# Patient Record
Sex: Female | Born: 1970 | Race: White | Hispanic: No | Marital: Married | Smoking: Never smoker
Health system: Southern US, Community
[De-identification: ages and names within clinical notes are randomized; demographics above are authoritative.]

## PROBLEM LIST (undated history)

## (undated) DIAGNOSIS — I2510 Atherosclerotic heart disease of native coronary artery without angina pectoris: Secondary | ICD-10-CM

## (undated) DIAGNOSIS — T4145XA Adverse effect of unspecified anesthetic, initial encounter: Secondary | ICD-10-CM

## (undated) DIAGNOSIS — T7840XA Allergy, unspecified, initial encounter: Secondary | ICD-10-CM

## (undated) DIAGNOSIS — F329 Major depressive disorder, single episode, unspecified: Secondary | ICD-10-CM

## (undated) DIAGNOSIS — F32A Depression, unspecified: Secondary | ICD-10-CM

## (undated) DIAGNOSIS — K219 Gastro-esophageal reflux disease without esophagitis: Secondary | ICD-10-CM

## (undated) DIAGNOSIS — T8859XA Other complications of anesthesia, initial encounter: Secondary | ICD-10-CM

## (undated) DIAGNOSIS — K649 Unspecified hemorrhoids: Secondary | ICD-10-CM

## (undated) DIAGNOSIS — N390 Urinary tract infection, site not specified: Secondary | ICD-10-CM

## (undated) HISTORY — DX: Depression, unspecified: F32.A

## (undated) HISTORY — DX: Allergy, unspecified, initial encounter: T78.40XA

## (undated) HISTORY — DX: Gastro-esophageal reflux disease without esophagitis: K21.9

## (undated) HISTORY — DX: Unspecified hemorrhoids: K64.9

## (undated) HISTORY — PX: UPPER GASTROINTESTINAL ENDOSCOPY: SHX188

## (undated) HISTORY — DX: Atherosclerotic heart disease of native coronary artery without angina pectoris: I25.10

## (undated) HISTORY — DX: Major depressive disorder, single episode, unspecified: F32.9

## (undated) HISTORY — DX: Urinary tract infection, site not specified: N39.0

---
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
## 1997-12-12 ENCOUNTER — Inpatient Hospital Stay (HOSPITAL_COMMUNITY): Admission: AD | Admit: 1997-12-12 | Discharge: 1997-12-12 | Payer: Self-pay | Admitting: *Deleted

## 1998-01-24 ENCOUNTER — Inpatient Hospital Stay (HOSPITAL_COMMUNITY): Admission: AD | Admit: 1998-01-24 | Discharge: 1998-01-24 | Payer: Self-pay | Admitting: Obstetrics and Gynecology

## 1998-02-24 ENCOUNTER — Inpatient Hospital Stay (HOSPITAL_COMMUNITY): Admission: AD | Admit: 1998-02-24 | Discharge: 1998-02-24 | Payer: Self-pay | Admitting: *Deleted

## 1998-02-28 ENCOUNTER — Inpatient Hospital Stay (HOSPITAL_COMMUNITY): Admission: AD | Admit: 1998-02-28 | Discharge: 1998-03-02 | Payer: Self-pay | Admitting: Obstetrics and Gynecology

## 1998-04-11 ENCOUNTER — Inpatient Hospital Stay (HOSPITAL_COMMUNITY): Admission: AD | Admit: 1998-04-11 | Discharge: 1998-04-11 | Payer: Self-pay | Admitting: Obstetrics and Gynecology

## 1998-04-16 ENCOUNTER — Inpatient Hospital Stay (HOSPITAL_COMMUNITY): Admission: AD | Admit: 1998-04-16 | Discharge: 1998-04-18 | Payer: Self-pay | Admitting: Obstetrics and Gynecology

## 1998-04-22 ENCOUNTER — Encounter (HOSPITAL_COMMUNITY): Admission: RE | Admit: 1998-04-22 | Discharge: 1998-07-21 | Payer: Self-pay | Admitting: *Deleted

## 2000-09-19 ENCOUNTER — Other Ambulatory Visit: Admission: RE | Admit: 2000-09-19 | Discharge: 2000-09-19 | Payer: Self-pay | Admitting: Obstetrics and Gynecology

## 2000-12-08 ENCOUNTER — Inpatient Hospital Stay (HOSPITAL_COMMUNITY): Admission: AD | Admit: 2000-12-08 | Discharge: 2000-12-08 | Payer: Self-pay | Admitting: Obstetrics and Gynecology

## 2001-02-03 ENCOUNTER — Inpatient Hospital Stay (HOSPITAL_COMMUNITY): Admission: AD | Admit: 2001-02-03 | Discharge: 2001-02-03 | Payer: Self-pay | Admitting: Obstetrics & Gynecology

## 2001-03-23 ENCOUNTER — Inpatient Hospital Stay (HOSPITAL_COMMUNITY): Admission: AD | Admit: 2001-03-23 | Discharge: 2001-03-24 | Payer: Self-pay | Admitting: Obstetrics and Gynecology

## 2001-04-02 ENCOUNTER — Encounter: Admission: RE | Admit: 2001-04-02 | Discharge: 2001-05-02 | Payer: Self-pay | Admitting: Obstetrics and Gynecology

## 2002-05-29 ENCOUNTER — Other Ambulatory Visit: Admission: RE | Admit: 2002-05-29 | Discharge: 2002-05-29 | Payer: Self-pay | Admitting: Obstetrics and Gynecology

## 2002-12-24 ENCOUNTER — Other Ambulatory Visit: Admission: RE | Admit: 2002-12-24 | Discharge: 2002-12-24 | Payer: Self-pay | Admitting: Obstetrics and Gynecology

## 2003-06-25 ENCOUNTER — Other Ambulatory Visit: Admission: RE | Admit: 2003-06-25 | Discharge: 2003-06-25 | Payer: Self-pay | Admitting: Obstetrics and Gynecology

## 2004-05-12 ENCOUNTER — Other Ambulatory Visit: Admission: RE | Admit: 2004-05-12 | Discharge: 2004-05-12 | Payer: Self-pay | Admitting: Obstetrics and Gynecology

## 2005-04-21 ENCOUNTER — Ambulatory Visit: Payer: Self-pay | Admitting: Internal Medicine

## 2005-05-05 ENCOUNTER — Encounter (INDEPENDENT_AMBULATORY_CARE_PROVIDER_SITE_OTHER): Payer: Self-pay | Admitting: *Deleted

## 2005-05-05 ENCOUNTER — Ambulatory Visit: Payer: Self-pay | Admitting: Internal Medicine

## 2005-08-23 ENCOUNTER — Other Ambulatory Visit: Admission: RE | Admit: 2005-08-23 | Discharge: 2005-08-23 | Payer: Self-pay | Admitting: Obstetrics and Gynecology

## 2005-09-08 ENCOUNTER — Encounter: Admission: RE | Admit: 2005-09-08 | Discharge: 2005-09-08 | Payer: Self-pay | Admitting: Obstetrics and Gynecology

## 2007-01-03 ENCOUNTER — Encounter: Admission: RE | Admit: 2007-01-03 | Discharge: 2007-01-03 | Payer: Self-pay | Admitting: Obstetrics and Gynecology

## 2007-02-15 ENCOUNTER — Encounter: Admission: RE | Admit: 2007-02-15 | Discharge: 2007-02-15 | Payer: Self-pay | Admitting: Family Medicine

## 2009-04-14 ENCOUNTER — Inpatient Hospital Stay (HOSPITAL_COMMUNITY): Admission: AD | Admit: 2009-04-14 | Discharge: 2009-04-14 | Payer: Self-pay | Admitting: Obstetrics & Gynecology

## 2009-08-23 ENCOUNTER — Inpatient Hospital Stay (HOSPITAL_COMMUNITY): Admission: AD | Admit: 2009-08-23 | Discharge: 2009-08-24 | Payer: Self-pay | Admitting: Obstetrics and Gynecology

## 2009-08-25 ENCOUNTER — Inpatient Hospital Stay (HOSPITAL_COMMUNITY): Admission: AD | Admit: 2009-08-25 | Discharge: 2009-08-27 | Payer: Self-pay | Admitting: Obstetrics and Gynecology

## 2009-08-25 ENCOUNTER — Encounter (INDEPENDENT_AMBULATORY_CARE_PROVIDER_SITE_OTHER): Payer: Self-pay | Admitting: Obstetrics and Gynecology

## 2010-07-18 ENCOUNTER — Encounter: Payer: Self-pay | Admitting: Obstetrics and Gynecology

## 2010-09-17 LAB — CBC
HCT: 29.9 % — ABNORMAL LOW (ref 36.0–46.0)
HCT: 34.2 % — ABNORMAL LOW (ref 36.0–46.0)
Hemoglobin: 10.3 g/dL — ABNORMAL LOW (ref 12.0–15.0)
Hemoglobin: 11.8 g/dL — ABNORMAL LOW (ref 12.0–15.0)
MCHC: 34.5 g/dL (ref 30.0–36.0)
MCV: 93.2 fL (ref 78.0–100.0)
Platelets: 139 10*3/uL — ABNORMAL LOW (ref 150–400)
RBC: 3.69 MIL/uL — ABNORMAL LOW (ref 3.87–5.11)

## 2010-09-30 LAB — URINALYSIS, ROUTINE W REFLEX MICROSCOPIC
Bilirubin Urine: NEGATIVE
Glucose, UA: NEGATIVE mg/dL
Hgb urine dipstick: NEGATIVE
Ketones, ur: NEGATIVE mg/dL
Nitrite: NEGATIVE
Specific Gravity, Urine: 1.01 (ref 1.005–1.030)

## 2010-09-30 LAB — CBC
HCT: 34.1 % — ABNORMAL LOW (ref 36.0–46.0)
Hemoglobin: 11.7 g/dL — ABNORMAL LOW (ref 12.0–15.0)
Platelets: 174 10*3/uL (ref 150–400)
RBC: 3.73 MIL/uL — ABNORMAL LOW (ref 3.87–5.11)

## 2010-09-30 LAB — WET PREP, GENITAL: Trich, Wet Prep: NONE SEEN

## 2010-09-30 LAB — URINE CULTURE

## 2012-02-01 ENCOUNTER — Other Ambulatory Visit (HOSPITAL_COMMUNITY): Payer: Self-pay | Admitting: Obstetrics and Gynecology

## 2012-02-01 ENCOUNTER — Other Ambulatory Visit: Payer: Self-pay | Admitting: Obstetrics and Gynecology

## 2012-02-01 DIAGNOSIS — N971 Female infertility of tubal origin: Secondary | ICD-10-CM

## 2012-02-13 ENCOUNTER — Ambulatory Visit (HOSPITAL_COMMUNITY): Payer: Self-pay

## 2013-01-29 ENCOUNTER — Emergency Department (HOSPITAL_COMMUNITY)
Admission: EM | Admit: 2013-01-29 | Discharge: 2013-01-29 | Disposition: A | Discharge status: Home / Self Care | Payer: Managed Care, Other (non HMO) | Attending: Emergency Medicine | Admitting: Emergency Medicine

## 2013-01-29 ENCOUNTER — Emergency Department (HOSPITAL_COMMUNITY): Payer: Managed Care, Other (non HMO)

## 2013-01-29 ENCOUNTER — Encounter (HOSPITAL_COMMUNITY): Payer: Self-pay | Admitting: Anesthesiology

## 2013-01-29 ENCOUNTER — Encounter (HOSPITAL_COMMUNITY): Payer: Self-pay | Admitting: Emergency Medicine

## 2013-01-29 ENCOUNTER — Encounter (HOSPITAL_COMMUNITY)
Admission: EM | Disposition: A | Discharge status: Home / Self Care | Payer: Self-pay | Source: Home / Self Care | Attending: Emergency Medicine

## 2013-01-29 ENCOUNTER — Emergency Department (HOSPITAL_COMMUNITY): Payer: Managed Care, Other (non HMO) | Admitting: Anesthesiology

## 2013-01-29 DIAGNOSIS — R6889 Other general symptoms and signs: Secondary | ICD-10-CM | POA: Insufficient documentation

## 2013-01-29 DIAGNOSIS — T17408A Unspecified foreign body in trachea causing other injury, initial encounter: Secondary | ICD-10-CM

## 2013-01-29 HISTORY — PX: DIRECT LARYNGOSCOPY: SHX5326

## 2013-01-29 SURGERY — LARYNGOSCOPY, DIRECT
Anesthesia: General | Site: Esophagus | Wound class: Clean Contaminated

## 2013-01-29 MED ORDER — LORAZEPAM 1 MG PO TABS: 1.0000 mg | ORAL_TABLET | Freq: Once | ORAL | Status: DC

## 2013-01-29 MED ORDER — LACTATED RINGERS IV SOLN
INTRAVENOUS | Status: DC | PRN
  Administered 2013-01-29: 05:00:00 via INTRAVENOUS

## 2013-01-29 MED ORDER — ROCURONIUM BROMIDE 50 MG/5ML IV SOLN
INTRAVENOUS | Status: AC
  Filled 2013-01-29: qty 2

## 2013-01-29 MED ORDER — MIDAZOLAM HCL 5 MG/5ML IJ SOLN
INTRAMUSCULAR | Status: DC | PRN
  Administered 2013-01-29: 2 mg via INTRAVENOUS

## 2013-01-29 MED ORDER — FENTANYL CITRATE 0.05 MG/ML IJ SOLN: 25.0000 ug | INTRAMUSCULAR | Status: DC | PRN

## 2013-01-29 MED ORDER — LACTATED RINGERS IV SOLN: INTRAVENOUS | Status: DC

## 2013-01-29 MED ORDER — ONDANSETRON HCL 4 MG/2ML IJ SOLN
INTRAMUSCULAR | Status: DC | PRN
  Administered 2013-01-29: 4 mg via INTRAVENOUS

## 2013-01-29 MED ORDER — PROMETHAZINE HCL 25 MG/ML IJ SOLN: 6.2500 mg | INTRAMUSCULAR | Status: DC | PRN

## 2013-01-29 MED ORDER — NEOSTIGMINE METHYLSULFATE 1 MG/ML IJ SOLN
INTRAMUSCULAR | Status: DC | PRN
  Administered 2013-01-29: 5 mg via INTRAVENOUS

## 2013-01-29 MED ORDER — GLYCOPYRROLATE 0.2 MG/ML IJ SOLN
INTRAMUSCULAR | Status: DC | PRN
  Administered 2013-01-29: 0.6 mg via INTRAVENOUS

## 2013-01-29 MED ORDER — ETOMIDATE 2 MG/ML IV SOLN
INTRAVENOUS | Status: AC
  Filled 2013-01-29: qty 20

## 2013-01-29 MED ORDER — GLUCAGON HCL (RDNA) 1 MG IJ SOLR
2.0000 mg | Freq: Once | INTRAMUSCULAR | Status: AC
  Administered 2013-01-29: 2 mg via INTRAVENOUS
  Filled 2013-01-29: qty 1

## 2013-01-29 MED ORDER — DEXAMETHASONE SODIUM PHOSPHATE 10 MG/ML IJ SOLN
INTRAMUSCULAR | Status: DC | PRN
  Administered 2013-01-29: 10 mg via INTRAVENOUS

## 2013-01-29 MED ORDER — FENTANYL CITRATE 0.05 MG/ML IJ SOLN
INTRAMUSCULAR | Status: DC | PRN
  Administered 2013-01-29: 50 ug via INTRAVENOUS

## 2013-01-29 MED ORDER — PROPOFOL 10 MG/ML IV BOLUS
INTRAVENOUS | Status: DC | PRN
  Administered 2013-01-29: 150 mg via INTRAVENOUS

## 2013-01-29 MED ORDER — MEPERIDINE HCL 50 MG/ML IJ SOLN: 6.2500 mg | INTRAMUSCULAR | Status: DC | PRN

## 2013-01-29 MED ORDER — CISATRACURIUM BESYLATE (PF) 10 MG/5ML IV SOLN
INTRAVENOUS | Status: DC | PRN
  Administered 2013-01-29: 4 mg via INTRAVENOUS

## 2013-01-29 MED ORDER — SUCCINYLCHOLINE CHLORIDE 20 MG/ML IJ SOLN
INTRAMUSCULAR | Status: DC | PRN
  Administered 2013-01-29: 100 mg via INTRAVENOUS

## 2013-01-29 MED ORDER — SUCCINYLCHOLINE CHLORIDE 20 MG/ML IJ SOLN
INTRAMUSCULAR | Status: AC
  Filled 2013-01-29: qty 5

## 2013-01-29 MED ORDER — LIDOCAINE HCL (CARDIAC) 20 MG/ML IV SOLN
INTRAVENOUS | Status: AC
  Filled 2013-01-29: qty 5

## 2013-01-29 MED ORDER — LORAZEPAM 2 MG/ML IJ SOLN
1.0000 mg | Freq: Once | INTRAMUSCULAR | Status: AC
  Administered 2013-01-29: 1 mg via INTRAVENOUS
  Filled 2013-01-29: qty 1

## 2013-01-29 SURGICAL SUPPLY — 2 items
DRAPE LG THREE QUARTER DISP (DRAPES) ×1 IMPLANT
TUBING CONNECTING 10 (TUBING) ×1 IMPLANT

## 2013-01-29 NOTE — Anesthesia Postprocedure Evaluation (Signed)
  Anesthesia Post-op Note  Patient: Susan Morales  Procedure(s) Performed: Procedure(s) (LRB): DIRECT LARYNGOSCOPY, esophagoscopy, bronchoscopy,  (N/A)  Patient Location: PACU  Anesthesia Type: General  Level of Consciousness: awake and alert   Airway and Oxygen Therapy: Patient Spontanous Breathing  Post-op Pain: mild  Post-op Assessment: Post-op Vital signs reviewed, Patient's Cardiovascular Status Stable, Respiratory Function Stable, Patent Airway and No signs of Nausea or vomiting  Last Vitals:  Filed Vitals:   01/29/13 0536  BP:   Pulse: 105  Temp: 36.6 C  Resp: 23    Post-op Vital Signs: stable   Complications: No apparent anesthesia complications

## 2013-01-29 NOTE — ED Provider Notes (Signed)
CSN: [id number]     Arrival date & time 01/29/13  0016 History     First MD Initiated Contact with Patient 01/29/13 [phone]     Chief Complaint  Patient presents with  . Foreign Body   (Consider location/radiation/quality/duration/timing/severity/associated sxs/prior Treatment) HPI  42 year old female with a foreign body sensation in her throat. Onset around 1930 today. The patient was eating steak and felt like she did not adequately to a piece. She's felt like the T-System lodged in her throat since this time. She spits up anytime she tries to drink anything. No respiratory complaints. No wheezing. No nausea or vomiting. No drooling. No change in her voice. No hx of similar symptoms. Patient reports previous endoscopy years ago but no history of esophageal strictures that she is aware of.   History reviewed. No pertinent past medical history. History reviewed. No pertinent past surgical history. History reviewed. No pertinent family history. History  Substance Use Topics  . Smoking status: Not on file  . Smokeless tobacco: Not on file  . Alcohol Use: Not on file   OB History   Grav Para Term Preterm Abortions TAB SAB Ect Mult Living                 Review of Systems  All systems reviewed and negative, other than as noted in HPI.   Allergies  Review of patient's allergies indicates not on file.  Home Medications  No current outpatient prescriptions on file. BP 162/88  Pulse 116  Temp(Src) 98.1 F (36.7 C) (Oral)  Resp 20  Ht 5\' 4"  (1.626 m)  Wt 235 lb (106.595 kg)  BMI 40.32 kg/m2  SpO2 98%  LMP 01/28/2013 Physical Exam  Nursing note and vitals reviewed. Constitutional: She appears well-developed and well-nourished. No distress.  Sitting up in bed. Appears comfortable.  HENT:  Head: Normocephalic and atraumatic.  Posterior pharynx clear. Handling secretions. Normal sounding phonation.   Eyes: Conjunctivae are normal. Right eye exhibits no discharge. Left eye  exhibits no discharge.  Neck: Neck supple.  Cardiovascular: Normal rate, regular rhythm and normal heart sounds.  Exam reveals no gallop and no friction rub.   No murmur heard. Pulmonary/Chest: Effort normal and breath sounds normal. No stridor. No respiratory distress. She has no wheezes.  No increased wob. Lungs clear.   Abdominal: Soft. She exhibits no distension. There is no tenderness.  Musculoskeletal: She exhibits no edema and no tenderness.  Neurological: She is alert.  Skin: Skin is warm and dry.  Psychiatric: She has a normal mood and affect. Her behavior is normal. Thought content normal.    ED Course   Procedures (including critical care time)  CRITICAL CARE Performed by: Raeford Razor  Total critical care time: 30 minutes  Critical care time was exclusive of separately billable procedures and treating other patients. Critical care was necessary to treat or prevent imminent or life-threatening deterioration. Critical care was time spent personally by me on the following activities: development of treatment plan with patient and/or surrogate as well as nursing, discussions with consultants, evaluation of patient's response to treatment, examination of patient, obtaining history from patient or surrogate, ordering and performing treatments and interventions, ordering and review of laboratory studies, ordering and review of radiographic studies, pulse oximetry and re-evaluation of patient's condition.   Labs Reviewed - No data to display Dg Neck Soft Tissue  01/29/2013   *RADIOLOGY REPORT*  Clinical Data: Foreign body  NECK SOFT TISSUES - 1+ VIEW  Comparison: None.  Findings:  There is a more facet tissue density overlying the tracheal column in the supraglottic larynx, just inferior to the epiglottis, and above the cricoid cartilage. This measures approximately 1.0 x 1.7 cm.  Findings is suggestive of possible retained foreign body.  No other abnormalities seen within the neck.   IMPRESSION: Amorphous soft tissue density overlying the tracheal air column within the supraglottic larynx, inferior to the epiglottis and above the cricoid cartilage, which may represent a retained foreign body given the history of food bolus stuck in throat.   Original Report Authenticated By: Rise Mu, M.D.   1. Tracheal foreign body, initial encounter     MDM  42yF with possible foreign body. I suspect more globus hystericus. No respiratory distress. Able to get down sips of water. No drooling/spitting. Will obtain soft tissue films of neck.   1:40 AM Imaging as above.  Bronchoscopy best option? Will discuss with CCM/pulmonology.   2:10 AM Discussed with Dr Herma Carson. Thinks ENT retrieval would be best. Paged. Pt remains w/o respiratory complaints. Has been moved to resus bay and cric kit at bedside.    2:22 AM Discussed with Dr Emeline Darling. Will take to OR.   Raeford Razor, MD 01/29/13 [phone]

## 2013-01-29 NOTE — H&P (Signed)
01/29/2013  Susan Morales  PREOPERATIVE HISTORY AND PHYSICAL  CHIEF COMPLAINT: foreign body aspiration  HISTORY: This is a 42 year old who presents with airway foreign body after eating steak around 2130 last night and has had foreign body sensation since then. Soft tissue Xray in ER reviewed by myself and radiology, suspicious for possible food bolus above larynx.  She now presents for direct laryngoscopy, bronchoscopy, esophagoscopy with possible foreign body removal, possible tracheotomy.  Dr. Emeline Darling, [place] has discussed the risks 9emergent need for tracheotomy, bleeding, airway injury, airway loss, death, aspiration, anoxic brain injury, etc.), benefits, and alternatives of this procedure. The patient understands the risks and would like to proceed with the procedure. The chances of success of the procedure are >50% and the patient understands this. I personally performed an examination of the patient within 24 hours of the procedure.  PAST MEDICAL HISTORY: History reviewed. No pertinent past medical history.  PAST SURGICAL HISTORY: History reviewed. No pertinent past surgical history.  MEDICATIONS: Scheduled Meds: . etomidate      . lidocaine (cardiac) 100 mg/71ml      . rocuronium      . succinylcholine       Continuous Infusions:  PRN Meds:. No current facility-administered medications on file prior to encounter.   No current outpatient prescriptions on file prior to encounter.    ALLERGIES: No Known Allergies   SOCIAL HISTORY: History   Social History  . Marital Status: Married    Spouse Name: N/A    Number of Children: N/A  . Years of Education: N/A   Occupational History  . Not on file.   Social History Main Topics  . Smoking status: Not on file  . Smokeless tobacco: Not on file  . Alcohol Use: Not on file  . Drug Use: Not on file  . Sexually Active: Not on file   Other Topics Concern  . Not on file   Social History Narrative  . No narrative on file     FAMILY HISTORY:History reviewed. No pertinent family history.  REVIEW OF SYSTEMS:  HEENT: globus/foreign body sensation, otherwise negative x 10 systems except per HPI   PHYSICAL EXAM:  GENERAL:  NAD VITAL SIGNS:   Filed Vitals:   01/29/13 0027  BP: 162/88  Pulse: 116  Temp: 98.1 F (36.7 C)  Resp: 20  SKIN:  Warm, dry HEENT:  Oral cavity clear, no stridor or stertor NECK:  Trachea midline LYMPH:  No LAD LUNGS:  Grossly clear CARDIOVASCULAR:  RRR ABDOMEN:  Soft, NT MUSCULOSKELETAL: normal strength PSYCH:  Normal affect NEUROLOGIC:  CN 2-12 intact and symmetric  DIAGNOSTIC STUDIES: soft tissue neck Xrays with soft tissue density in supraglottic larynx  ASSESSMENT AND PLAN: Plan to proceed with direct laryngoscopy, esophagoscopy, bronchoscopy with possible foreign body removal, possible tracheotomy. Patient understands the risks, benefits, and alternatives. Informed written consent signed, witnessed, and on chart. 01/29/2013  2:52 AM Susan Morales

## 2013-01-29 NOTE — Op Note (Signed)
DATE OF OPERATION: @T @ Surgeon: Melvenia Beam Procedure Performed: direct laryngoscopy, bronchoscopy, rigid esophagoscopy  PREOPERATIVE DIAGNOSIS: possible airway foreign body POSTOPERATIVE DIAGNOSIS: foreign body sensation, no foreign body seen.  SURGEON: Melvenia Beam ANESTHESIA: General endotracheal.  ESTIMATED BLOOD LOSS: none DRAINS: none SPECIMENS: none FINDINGS: normal epiglottis, piriform sinuses, subglottis, trachea, carina, mainstem bronchi, and esophagus with no foreign bodies seen. INDICATIONS: The patient is a 42yo with a history of eating steak last night, felt that she aspirated a piece of meat. Plain neck films showed possible airway foreign body in the supraglottic larynx.  DESCRIPTION OF OPERATION: The patient was brought to the operating room and was placed in the supine position and was placed under general endotracheal anesthesia by anesthesiology. No foreign bodies were seen by anesthesia on glidescope laryngoscopy or during intubation.  Direct laryngoscopy was performed using the anterior commissure scope laryngoscope. The tongue base, piriform sinuses, vallecula, and posterior pharynx were normal with no masses or lesions and no foreign bodies seen. The false vocal folds and true vocal folds were normal and mobile with no masses or foreign bodies seen. The laryngoscope was suspended using the suspension system.  Rigid bronchoscopy was performed using the 4mm 0 degree hopkins rod. This demonstrated a normal subglottis with no foreign bodies. Flexible bronchoscopy was then performed through the ET tube using the flexible 4mm bronchoscope. The trachea and carina and mainstem bronchi appeared normal with no foreign bodies seen.  Rigid esophagoscopy was performed using the adult esophagoscope. This demonstrated a normal cervical and thoracic esophagus with a normal  gastroesophageal junction and stomach mucosa. No foreign bodies or food impactions were seen in the esophagus.  The esophagoscope, bronchoscope, laryngoscope, and tooth guard were all removed from the patient.  The patient was turned back to anesthesia and awakened from anesthesia and extubated without difficulty. The patient tolerated the procedure well with no immediate complications and was taken to the postoperative recovery area in good condition.   Dr. Melvenia Beam was present and performed the entire procedure. 01/29/2013  5:38 AM Melvenia Beam

## 2013-01-29 NOTE — Anesthesia Preprocedure Evaluation (Addendum)
Anesthesia Evaluation  Patient identified by MRN, date of birth, ID band Patient awake  General Assessment Comment:NAD  Reviewed: Allergy & Precautions, H&P , NPO status , Patient's Chart, lab work & pertinent test results  Airway       Dental   Pulmonary neg pulmonary ROS,          Cardiovascular negative cardio ROS      Neuro/Psych negative neurological ROS  negative psych ROS   GI/Hepatic negative GI ROS, Neg liver ROS,   Endo/Other  negative endocrine ROSMorbid obesity  Renal/GU negative Renal ROS  negative genitourinary   Musculoskeletal negative musculoskeletal ROS (+)   Abdominal   Peds negative pediatric ROS (+)  Hematology negative hematology ROS (+)   Anesthesia Other Findings   Reproductive/Obstetrics negative OB ROS                          Anesthesia Physical Anesthesia Plan  ASA: II and emergent  Anesthesia Plan: General   Post-op Pain Management:    Induction: Rapid sequence and Intravenous  Airway Management Planned: Video Laryngoscope Planned  Additional Equipment:   Intra-op Plan:   Post-operative Plan: Extubation in OR  Informed Consent: I have reviewed the patients History and Physical, chart, labs and discussed the procedure including the risks, benefits and alternatives for the proposed anesthesia with the patient or authorized representative who has indicated his/her understanding and acceptance.   Dental advisory given  Plan Discussed with:   Anesthesia Plan Comments:        Anesthesia Quick Evaluation

## 2013-01-29 NOTE — Preoperative (Signed)
Beta Blockers   Reason not to administer Beta Blockers:Not Applicable 

## 2013-01-29 NOTE — Transfer of Care (Signed)
Immediate Anesthesia Transfer of Care Note  Patient: Susan Morales  Procedure(s) Performed: Procedure(s): DIRECT LARYNGOSCOPY, esophagoscopy, bronchoscopy,  (N/A)  Patient Location: PACU  Anesthesia Type:General  Level of Consciousness: awake, sedated and patient cooperative  Airway & Oxygen Therapy: Patient Spontanous Breathing and Patient connected to face mask oxygen  Post-op Assessment: Report given to PACU RN and Post -op Vital signs reviewed and stable  Post vital signs: Reviewed and stable  Complications: No apparent anesthesia complications

## 2013-01-29 NOTE — ED Notes (Signed)
Pt arrived from home via POV with a chief complaint of a foreign body lodge in her throat.  Pt is ambulatory, able to talk, her O2 saturation is 100.  Pt is complaining of a headache.  Pt ate steak around 1930.   Pt states she tried to drink water but it would only come out as emesis.

## 2013-01-30 ENCOUNTER — Encounter (HOSPITAL_COMMUNITY): Payer: Self-pay | Admitting: Otolaryngology

## 2013-03-29 ENCOUNTER — Other Ambulatory Visit: Payer: Self-pay | Admitting: Obstetrics and Gynecology

## 2013-06-27 HISTORY — PX: ABLATION: SHX5711

## 2015-05-07 ENCOUNTER — Other Ambulatory Visit: Payer: Self-pay

## 2015-05-07 DIAGNOSIS — Z1231 Encounter for screening mammogram for malignant neoplasm of breast: Secondary | ICD-10-CM

## 2015-05-28 ENCOUNTER — Ambulatory Visit: Payer: Managed Care, Other (non HMO)

## 2015-06-04 ENCOUNTER — Ambulatory Visit: Payer: Managed Care, Other (non HMO) | Admitting: Primary Care

## 2015-06-04 ENCOUNTER — Ambulatory Visit (INDEPENDENT_AMBULATORY_CARE_PROVIDER_SITE_OTHER): Payer: Managed Care, Other (non HMO) | Admitting: Primary Care

## 2015-06-04 ENCOUNTER — Encounter: Payer: Self-pay | Admitting: Primary Care

## 2015-06-04 VITALS — BP 126/82 | HR 90 | Temp 97.9°F | Ht 64.0 in | Wt 260.1 lb

## 2015-06-04 DIAGNOSIS — F419 Anxiety disorder, unspecified: Secondary | ICD-10-CM

## 2015-06-04 DIAGNOSIS — F32A Depression, unspecified: Secondary | ICD-10-CM

## 2015-06-04 DIAGNOSIS — F329 Major depressive disorder, single episode, unspecified: Secondary | ICD-10-CM | POA: Diagnosis not present

## 2015-06-04 DIAGNOSIS — K219 Gastro-esophageal reflux disease without esophagitis: Secondary | ICD-10-CM

## 2015-06-04 MED ORDER — FLUOXETINE HCL 20 MG PO TABS: 20.0000 mg | ORAL_TABLET | Freq: Every day | ORAL | Status: DC

## 2015-06-04 NOTE — Progress Notes (Signed)
Pre visit review using our clinic review tool, if applicable. No additional management support is needed unless otherwise documented below in the visit note. 

## 2015-06-04 NOTE — Assessment & Plan Note (Signed)
History of for 12 years. Takes famotidine 20 mg HS. Aware of triggers.

## 2015-06-04 NOTE — Progress Notes (Signed)
Subjective:    Patient ID: Susan Morales, female    DOB: 02-Jan-1971, 44 y.o.   MRN: [id number]  HPI  Susan Morales is a 44 year old female who presents today to establish care and discuss the problems mentioned below. Will obtain old records.  1) Depression: Diagnosed 15 years ago and was medication for 1 year which she cannot recall. She was able to come off of her medication without difficulty in the past. 1.5 years ago her mother fell and had a traumatic brain injury. She's been taking care of her of mother in her home for 1.5 years and has recently felt the impact and stress. She's felt an increased amount of stress with 4 kids and taking care of her mother, and is not enjoying being around her family. PHQ 9 score of 14 today. Denies SI/HI.   2) GERD: Diagnosed 12 years. She takes famotidine 20 mg every night at bedtime. She is aware of her triggers. Her symptoms include epigastric discomfort and burning, but her symptoms do not bother her often.   Review of Systems  Constitutional: Negative for unexpected weight change.  HENT: Negative for rhinorrhea.   Respiratory: Negative for cough and shortness of breath.   Cardiovascular: Negative for chest pain.  Gastrointestinal: Negative for diarrhea and constipation.  Genitourinary: Negative for difficulty urinating.       Irregular periods, follows with GYN.  Musculoskeletal: Negative for myalgias and arthralgias.  Skin: Negative for rash.  Neurological: Negative for dizziness, numbness and headaches.  Psychiatric/Behavioral: Negative for suicidal ideas and sleep disturbance. The patient is not nervous/anxious.        See HPI       Past Medical History  Diagnosis Date  . Hemorrhoid   . Depression   . GERD (gastroesophageal reflux disease)   . UTI (urinary tract infection)     Social History   Social History  . Marital Status: Married    Spouse Name: N/A  . Number of Children: N/A  . Years of Education: N/A   Occupational  History  . Not on file.   Social History Main Topics  . Smoking status: Never Smoker   . Smokeless tobacco: Not on file  . Alcohol Use: No  . Drug Use: No  . Sexual Activity: Not on file   Other Topics Concern  . Not on file   Social History Narrative   Married.   4 children.   Works as a Print production planner.   Enjoys reading, spending time with her family.    Past Surgical History  Procedure Laterality Date  . Direct laryngoscopy N/A 01/29/2013    Procedure: DIRECT LARYNGOSCOPY, esophagoscopy, bronchoscopy, ;  Surgeon: Ruby Cola, MD;  Location: [facility];  Service: ENT;  Laterality: N/A;    Family History  Problem Relation Age of Onset  . Adopted: Yes  . Other      Adopted    No Known Allergies  Current Outpatient Prescriptions on File Prior to Visit  Medication Sig Dispense Refill  . ibuprofen (ADVIL,MOTRIN) 200 MG tablet Take 200 mg by mouth every 6 (six) hours as needed for pain (pain).     No current facility-administered medications on file prior to visit.    BP 126/82 mmHg  Pulse 90  Temp(Src) 97.9 F (36.6 C) (Oral)  Ht 5\' 4"  (1.626 m)  Wt 260 lb 2.2 oz (117.999 kg)  BMI 44.63 kg/m2  SpO2 98%  LMP 03/31/2015    Objective:   Physical  Exam  Constitutional: She is oriented to person, place, and time. She appears well-nourished.  Cardiovascular: Normal rate and regular rhythm.   Pulmonary/Chest: Effort normal and breath sounds normal.  Neurological: She is alert and oriented to person, place, and time.  Skin: Skin is warm and dry.  Psychiatric: She has a normal mood and affect.          Assessment & Plan:

## 2015-06-04 NOTE — Assessment & Plan Note (Signed)
History of 15 years ago, has not been on medication for years. Recently feeling the stress of caring for her dependant mother, 4 children, and husband. PHQ 9 score of 14 today.  Start Fluoxetine 20 mg daily. Patient is to take 1/2 tablet daily for 6 days, then advance to 1 full tablet thereafter. We discussed possible side effects of headache, GI upset, drowsiness, and SI/HI. If thoughts of SI/HI develop, we discussed to present to the emergency immediately. Patient verbalized understanding.   Follow up in 6 weeks for re-evaluation.

## 2015-06-04 NOTE — Patient Instructions (Signed)
Start Fluoxetine (Prozac) for depression. Take 1/2 tablet by mouth daily for 6 days, then advance to 1 full tablet thereafter.   Follow up in 6 weeks for re-evaluation.  It was a pleasure to meet you today! Please don't hesitate to call me with any questions. Welcome to [facility]!

## 2015-06-11 ENCOUNTER — Ambulatory Visit
Admission: RE | Admit: 2015-06-11 | Discharge: 2015-06-11 | Disposition: A | Discharge status: Home / Self Care | Payer: Managed Care, Other (non HMO) | Source: Ambulatory Visit

## 2015-06-11 DIAGNOSIS — Z1231 Encounter for screening mammogram for malignant neoplasm of breast: Secondary | ICD-10-CM

## 2015-06-23 ENCOUNTER — Telehealth: Payer: Self-pay

## 2015-06-23 DIAGNOSIS — F411 Generalized anxiety disorder: Secondary | ICD-10-CM

## 2015-06-23 NOTE — Telephone Encounter (Signed)
Pt left v/m; pt was seen 06/04/15 and prozac started; pts mom has been admitted to hospital since pt seen and pt feels a lot of anxiety. Pt wants to know if needs different med or can prozac dosage be increased. Pt request cb.

## 2015-06-23 NOTE — Telephone Encounter (Signed)
It takes about 6 weeks for a medication like Prozac to gain its full effect, so I would not recommend increasing the dose just yet. I'm happy to send in low dose Xanax just once if she's feeling as though she cannot control her anxiety. Let me know! Thanks.

## 2015-06-24 MED ORDER — ALPRAZOLAM 0.25 MG PO TABS: 0.2500 mg | ORAL_TABLET | Freq: Two times a day (BID) | ORAL | Status: DC | PRN

## 2015-06-24 NOTE — Telephone Encounter (Signed)
Pt notified as instructed and pt does want med for anxiety sent to [address]. Pt will cb with update on how feeling if med does not help anxiety.

## 2015-06-24 NOTE — Telephone Encounter (Signed)
Left v/m for pt to cb. 

## 2015-06-24 NOTE — Telephone Encounter (Signed)
Notified pt as instructed and pt voiced understanding. Medication phoned to [facility] as [facility].

## 2015-06-24 NOTE — Telephone Encounter (Signed)
Please call in Alprazolam 0.25 mg tablets. Take 1 tablet by mouth twice daily as needed for anxiety. #30. No refills.  Please ensure she is aware that this medication is temporary as she's getting through this rough time with her mother. I do not keep patients on this medication long term. We will follow up as scheduled in January.

## 2015-06-25 ENCOUNTER — Telehealth: Payer: Self-pay | Admitting: Primary Care

## 2015-06-25 DIAGNOSIS — F411 Generalized anxiety disorder: Secondary | ICD-10-CM

## 2015-06-25 NOTE — Telephone Encounter (Signed)
Patient advised.

## 2015-06-25 NOTE — Telephone Encounter (Signed)
I've placed the referral for counseling and she should hear back soon. Please tell her to call me if she has any questions and that I'm sorry she's having such a rough time.

## 2015-06-25 NOTE — Telephone Encounter (Signed)
Pt would like referral for counseling  Please call [phone] Thank you

## 2015-07-06 ENCOUNTER — Encounter: Payer: Self-pay | Admitting: Primary Care

## 2015-07-09 ENCOUNTER — Encounter: Payer: Self-pay | Admitting: Primary Care

## 2015-07-09 ENCOUNTER — Ambulatory Visit (INDEPENDENT_AMBULATORY_CARE_PROVIDER_SITE_OTHER): Payer: Managed Care, Other (non HMO) | Admitting: Primary Care

## 2015-07-09 VITALS — BP 142/88 | HR 75 | Temp 98.0°F | Ht 64.0 in | Wt 257.1 lb

## 2015-07-09 DIAGNOSIS — F329 Major depressive disorder, single episode, unspecified: Secondary | ICD-10-CM

## 2015-07-09 DIAGNOSIS — F419 Anxiety disorder, unspecified: Principal | ICD-10-CM

## 2015-07-09 DIAGNOSIS — F418 Other specified anxiety disorders: Secondary | ICD-10-CM

## 2015-07-09 DIAGNOSIS — F32A Depression, unspecified: Secondary | ICD-10-CM

## 2015-07-09 MED ORDER — FLUOXETINE HCL 40 MG PO CAPS: 40.0000 mg | ORAL_CAPSULE | Freq: Every day | ORAL | Status: DC

## 2015-07-09 NOTE — Progress Notes (Signed)
Pre visit review using our clinic review tool, if applicable. No additional management support is needed unless otherwise documented below in the visit note. 

## 2015-07-09 NOTE — Assessment & Plan Note (Signed)
Improvement overall since initial visit; however not yet at goal. Increased stress with ill family member causing anxiety. Increase Fluoxetine to 40 mg daily. Continue alprazolam PRN as she uses this very sparingly. Discussed risk for dependence.  She is to email me in 1 month with an update. Follow up in 4 months for re-evaluation.

## 2015-07-09 NOTE — Patient Instructions (Addendum)
Start Fluoxetine 40 mg tablets for anxiety and depression. Take 1 tablet by mouth every morning.  Please call or email me in 1 month for an update. Don't hesitate to contact me with any questions.  Follow up in 4 months for re-evaluation.  It was a pleasure to see you today!

## 2015-07-09 NOTE — Progress Notes (Signed)
Subjective:    Patient ID: Susan Morales, female    DOB: Nov 03, 1970, 45 y.o.   MRN: [id number]  HPI  Susan Morales is a 45 year old female who presents today for follow up of depression and anxiety. She was evaluated last on 06/04/15 for depression and anxiety. PHQ 9 score of 14, recently going through a lot of family stress.  She was initiated on Fluoxetine 20 mg tablets last visit. She called in several weeks later reporting increased stress due to acutely ill mother at home with a history of Dementia. She was provided with a short term dose of alprazolam and referred to counseling.  Since her last visit she's feeling slightly improved as she's feeling calmer overall and can function a little better. She notices less irritability and does not yell at her children as she did prior.   She doesn't feel 100% improved as she continues to want to stay in bed in the morning. She's had a lot of stress due to health problems with her mother. She's taken a total of 5 of her alprazolam tablets since prescribed. She has an appointment scheduled in February 2017 with therapy.   She denies GI upset, headaches, SI/HI.    Review of Systems  Gastrointestinal: Negative for nausea and abdominal pain.  Neurological: Negative for headaches.  Psychiatric/Behavioral: Negative for suicidal ideas and sleep disturbance. The patient is nervous/anxious.        Past Medical History  Diagnosis Date  . Hemorrhoid   . Depression   . GERD (gastroesophageal reflux disease)   . UTI (urinary tract infection)     Social History   Social History  . Marital Status: Married    Spouse Name: N/A  . Number of Children: N/A  . Years of Education: N/A   Occupational History  . Not on file.   Social History Main Topics  . Smoking status: Never Smoker   . Smokeless tobacco: Not on file  . Alcohol Use: No  . Drug Use: No  . Sexual Activity: Not on file   Other Topics Concern  . Not on file   Social History  Narrative   Married.   4 children.   Works as a Print production planner.   Enjoys reading, spending time with her family.    Past Surgical History  Procedure Laterality Date  . Direct laryngoscopy N/A 01/29/2013    Procedure: DIRECT LARYNGOSCOPY, esophagoscopy, bronchoscopy, ;  Surgeon: Ruby Cola, MD;  Location: [facility];  Service: ENT;  Laterality: N/A;    Family History  Problem Relation Age of Onset  . Adopted: Yes  . Other      Adopted    No Known Allergies  Current Outpatient Prescriptions on File Prior to Visit  Medication Sig Dispense Refill  . ALPRAZolam (XANAX) 0.25 MG tablet Take 1 tablet (0.25 mg total) by mouth 2 (two) times daily as needed for anxiety. 30 tablet 0  . ibuprofen (ADVIL,MOTRIN) 200 MG tablet Take 200 mg by mouth every 6 (six) hours as needed for pain (pain). Reported on 07/09/2015     No current facility-administered medications on file prior to visit.    BP 142/88 mmHg  Pulse 75  Temp(Src) 98 F (36.7 C) (Oral)  Ht 5\' 4"  (1.626 m)  Wt 257 lb 1.9 oz (116.629 kg)  BMI 44.11 kg/m2  SpO2 98%  LMP 05/01/2015    Objective:   Physical Exam  Constitutional: She appears well-nourished.  Cardiovascular: Normal rate and regular rhythm.  Pulmonary/Chest: Effort normal and breath sounds normal.  Skin: Skin is warm and dry.  Psychiatric: She has a normal mood and affect.  Seems improved overall since last visit.          Assessment & Plan:

## 2015-07-15 ENCOUNTER — Encounter: Payer: Self-pay | Admitting: Primary Care

## 2015-07-24 ENCOUNTER — Ambulatory Visit (INDEPENDENT_AMBULATORY_CARE_PROVIDER_SITE_OTHER): Payer: Managed Care, Other (non HMO) | Admitting: Internal Medicine

## 2015-07-24 ENCOUNTER — Encounter: Payer: Self-pay | Admitting: Internal Medicine

## 2015-07-24 VITALS — BP 140/90 | HR 94 | Temp 98.0°F | Wt 255.0 lb

## 2015-07-24 DIAGNOSIS — J0100 Acute maxillary sinusitis, unspecified: Secondary | ICD-10-CM | POA: Insufficient documentation

## 2015-07-24 MED ORDER — AMOXICILLIN 500 MG PO TABS: 1000.0000 mg | ORAL_TABLET | Freq: Two times a day (BID) | ORAL | Status: DC

## 2015-07-24 NOTE — Progress Notes (Signed)
Pre visit review using our clinic review tool, if applicable. No additional management support is needed unless otherwise documented below in the visit note. 

## 2015-07-24 NOTE — Assessment & Plan Note (Signed)
Likely still viral Discussed supportive care If worsens, start the amoxil

## 2015-07-24 NOTE — Progress Notes (Signed)
   Subjective:    Patient ID: Susan Morales, female    DOB: 08-08-1970, 45 y.o.   MRN: [id number]  HPI Here due to respiratory illness  Feels really weak Has to take care of everyone Cold symptoms started 5-6 days ago--soon after having urterine ablation Some throat drainage but not bad PND Not a bad sore throat No SOB May have had low grade fever---feels cold No cough Frontal pressure and occipital headache  Taking sudafed and tylenol.  Not much help--may be keeping it controlled Just feels more and more tired  Current Outpatient Prescriptions on File Prior to Visit  Medication Sig Dispense Refill  . ALPRAZolam (XANAX) 0.25 MG tablet Take 1 tablet (0.25 mg total) by mouth 2 (two) times daily as needed for anxiety. 30 tablet 0  . FLUoxetine (PROZAC) 40 MG capsule Take 1 capsule (40 mg total) by mouth daily. 30 capsule 3  . ibuprofen (ADVIL,MOTRIN) 200 MG tablet Take 200 mg by mouth every 6 (six) hours as needed for pain (pain). Reported on 07/09/2015     No current facility-administered medications on file prior to visit.    No Known Allergies  Past Medical History  Diagnosis Date  . Hemorrhoid   . Depression   . GERD (gastroesophageal reflux disease)   . UTI (urinary tract infection)     Past Surgical History  Procedure Laterality Date  . Direct laryngoscopy N/A 01/29/2013    Procedure: DIRECT LARYNGOSCOPY, esophagoscopy, bronchoscopy, ;  Surgeon: Ruby Cola, MD;  Location: [facility];  Service: ENT;  Laterality: N/A;    Family History  Problem Relation Age of Onset  . Adopted: Yes  . Other      Adopted    Social History   Social History  . Marital Status: Married    Spouse Name: N/A  . Number of Children: N/A  . Years of Education: N/A   Occupational History  . Not on file.   Social History Main Topics  . Smoking status: Never Smoker   . Smokeless tobacco: Never Used  . Alcohol Use: No  . Drug Use: No  . Sexual Activity: Not on file   Other Topics  Concern  . Not on file   Social History Narrative   Married.   4 children.   Works as a Print production planner.   Enjoys reading, spending time with her family.   Review of Systems No rash No vomiting or diarrhea Appetite off but able to eat Has funeral for mom's significant other ---together for years and her kids grandfather (that they know)    Objective:   Physical Exam  Constitutional: She appears well-developed and well-nourished. No distress.  HENT:  Maxillary > frontal tenderness TMs normal Moderate nasal inflammation and green mucus Pharynx negative  Neck: Normal range of motion. Neck supple. No thyromegaly present.  Pulmonary/Chest: Effort normal and breath sounds normal. No respiratory distress. She has no wheezes. She has no rales.  Lymphadenopathy:    She has no cervical adenopathy.          Assessment & Plan:

## 2015-07-24 NOTE — Patient Instructions (Signed)
Start the amoxicillin if you worsen over the next few days.

## 2015-08-06 ENCOUNTER — Ambulatory Visit: Payer: Managed Care, Other (non HMO) | Admitting: Psychology

## 2015-08-10 ENCOUNTER — Telehealth: Payer: Self-pay | Admitting: Primary Care

## 2015-08-10 NOTE — Telephone Encounter (Signed)
Will you please call Ms. Stogdill to see how she's feeling on her increased dose of Fluoxetine? We bumped it up from 20 mg to 40 mg in mid January. Thanks.

## 2015-08-10 NOTE — Telephone Encounter (Signed)
Message left for patient to return my call.  

## 2015-08-11 NOTE — Telephone Encounter (Signed)
Patient returned Chan's call.  Please call patient back [phone]

## 2015-08-11 NOTE — Telephone Encounter (Signed)
Called patient and she stated that she doing good with the increase. Not problems to report.

## 2015-11-12 ENCOUNTER — Encounter: Payer: Self-pay | Admitting: Primary Care

## 2015-11-12 ENCOUNTER — Ambulatory Visit (INDEPENDENT_AMBULATORY_CARE_PROVIDER_SITE_OTHER): Payer: Managed Care, Other (non HMO) | Admitting: Primary Care

## 2015-11-12 VITALS — BP 118/78 | HR 74 | Temp 97.9°F | Ht 64.0 in | Wt 251.0 lb

## 2015-11-12 DIAGNOSIS — F418 Other specified anxiety disorders: Secondary | ICD-10-CM | POA: Diagnosis not present

## 2015-11-12 DIAGNOSIS — F419 Anxiety disorder, unspecified: Principal | ICD-10-CM

## 2015-11-12 DIAGNOSIS — F329 Major depressive disorder, single episode, unspecified: Secondary | ICD-10-CM

## 2015-11-12 MED ORDER — FLUOXETINE HCL 40 MG PO CAPS: 40.0000 mg | ORAL_CAPSULE | Freq: Every day | ORAL | Status: DC

## 2015-11-12 NOTE — Progress Notes (Signed)
   Subjective:    Patient ID: Susan Morales, female    DOB: [birth year], 45 y.o.   MRN: [id number]  HPI  Susan Morales is a 45 year old female who presents today for follow up of anxiety and depression. Currently managed on Fluoxetine 40 mg that was increased last visit in January 2017 as her anxiety improved, but was not yet at goal. She was using her alprazolam sparingly.  Since her last visit she's feeling improved. She did recently experience a loss as her mother passed away. She's coping well and feels the medication is helping her to get through her grief.   She will noticed an increase in anxiety and dizziness when she's forgotten her medication a few times. These symptoms will dissipate once she resumes. She's not had to use her alprazolam much as she still has 7 tablets remaining. She's not seen therapy as prescribed as she is currently seeing someone through hospice.   Denies SI/HI, GI upset, headaches.  Review of Systems  Respiratory: Negative for shortness of breath.   Cardiovascular: Negative for chest pain.  Neurological: Negative for dizziness.  Psychiatric/Behavioral: Negative for suicidal ideas. The patient is not nervous/anxious.        Past Medical History  Diagnosis Date  . Hemorrhoid   . Depression   . GERD (gastroesophageal reflux disease)   . UTI (urinary tract infection)      Social History   Social History  . Marital Status: Married    Spouse Name: N/A  . Number of Children: N/A  . Years of Education: N/A   Occupational History  . Not on file.   Social History Main Topics  . Smoking status: Never Smoker   . Smokeless tobacco: Never Used  . Alcohol Use: No  . Drug Use: No  . Sexual Activity: Not on file   Other Topics Concern  . Not on file   Social History Narrative   Married.   4 children.   Works as a Print production planner.   Enjoys reading, spending time with her family.    Past Surgical History  Procedure Laterality Date  . Direct  laryngoscopy N/A 01/29/2013    Procedure: DIRECT LARYNGOSCOPY, esophagoscopy, bronchoscopy, ;  Surgeon: Ruby Cola, MD;  Location: [facility];  Service: ENT;  Laterality: N/A;    Family History  Problem Relation Age of Onset  . Adopted: Yes  . Other      Adopted    No Known Allergies  Current Outpatient Prescriptions on File Prior to Visit  Medication Sig Dispense Refill  . ALPRAZolam (XANAX) 0.25 MG tablet Take 1 tablet (0.25 mg total) by mouth 2 (two) times daily as needed for anxiety. 30 tablet 0  . ibuprofen (ADVIL,MOTRIN) 200 MG tablet Take 200 mg by mouth every 6 (six) hours as needed for pain (pain). Reported on 07/09/2015     No current facility-administered medications on file prior to visit.    BP 118/78 mmHg  Pulse 74  Temp(Src) 97.9 F (36.6 C) (Oral)  Ht 5\' 4"  (1.626 m)  Wt 251 lb (113.853 kg)  BMI 43.06 kg/m2  SpO2 99%    Objective:   Physical Exam  Constitutional: She appears well-nourished.  Cardiovascular: Normal rate and regular rhythm.   Pulmonary/Chest: Effort normal and breath sounds normal.  Skin: Skin is warm and dry.  Psychiatric: She has a normal mood and affect.          Assessment & Plan:

## 2015-11-12 NOTE — Progress Notes (Signed)
Pre visit review using our clinic review tool, if applicable. No additional management support is needed unless otherwise documented below in the visit note. 

## 2015-11-12 NOTE — Patient Instructions (Signed)
Continue Fluoxetine 40 mg tablets for anxiety. I've sent refills to your pharmacy.  Please schedule a physical with me before the end of the year. You may also schedule a lab only appointment 3-4 days prior. We will discuss your lab results in detail during your physical.  Please call or e-mail me if you need anything.  It was a pleasure to see you today!

## 2015-11-12 NOTE — Assessment & Plan Note (Signed)
Improved and much better with increase of Fluoxetine to 40 mg. Still has 7 alprazolam tablets remaining from original prescription. She's coping well with her mother's death. Exam unremarkable. Will continue current regimen. Follow up as needed.

## 2016-01-01 ENCOUNTER — Other Ambulatory Visit: Payer: Self-pay | Admitting: Primary Care

## 2016-01-01 DIAGNOSIS — Z0000 Encounter for general adult medical examination without abnormal findings: Secondary | ICD-10-CM

## 2016-01-01 DIAGNOSIS — R7303 Prediabetes: Secondary | ICD-10-CM

## 2016-01-07 ENCOUNTER — Other Ambulatory Visit: Payer: Managed Care, Other (non HMO)

## 2016-01-11 ENCOUNTER — Encounter: Payer: Managed Care, Other (non HMO) | Admitting: Primary Care

## 2016-05-23 ENCOUNTER — Encounter: Payer: Self-pay | Admitting: Primary Care

## 2016-05-26 ENCOUNTER — Ambulatory Visit: Payer: Managed Care, Other (non HMO) | Admitting: Primary Care

## 2016-07-16 ENCOUNTER — Other Ambulatory Visit: Payer: Self-pay | Admitting: Primary Care

## 2016-07-16 DIAGNOSIS — F419 Anxiety disorder, unspecified: Principal | ICD-10-CM

## 2016-07-16 DIAGNOSIS — F329 Major depressive disorder, single episode, unspecified: Secondary | ICD-10-CM

## 2016-07-18 NOTE — Telephone Encounter (Signed)
Ok to refill? Electronically refill request for   FLUoxetine (PROZAC) 40 MG capsule  Last prescribed and seen on 11/12/2015.

## 2016-07-21 ENCOUNTER — Other Ambulatory Visit: Payer: Self-pay | Admitting: Obstetrics and Gynecology

## 2016-07-21 DIAGNOSIS — Z1231 Encounter for screening mammogram for malignant neoplasm of breast: Secondary | ICD-10-CM

## 2016-07-29 ENCOUNTER — Telehealth: Payer: Self-pay | Admitting: Internal Medicine

## 2016-07-29 MED ORDER — OSELTAMIVIR PHOSPHATE 75 MG PO CAPS: 75.0000 mg | ORAL_CAPSULE | Freq: Every day | ORAL | 0 refills | Status: DC

## 2016-07-29 NOTE — Telephone Encounter (Signed)
Husband in with clear cut flu. Daughter tested positive Will give Rx for preventative

## 2016-08-04 ENCOUNTER — Ambulatory Visit
Admission: RE | Admit: 2016-08-04 | Discharge: 2016-08-04 | Disposition: A | Discharge status: Home / Self Care | Payer: Managed Care, Other (non HMO) | Source: Ambulatory Visit | Attending: Obstetrics and Gynecology | Admitting: Obstetrics and Gynecology

## 2016-08-04 DIAGNOSIS — Z1231 Encounter for screening mammogram for malignant neoplasm of breast: Secondary | ICD-10-CM

## 2016-09-15 ENCOUNTER — Other Ambulatory Visit: Payer: Managed Care, Other (non HMO)

## 2016-09-22 ENCOUNTER — Encounter: Payer: Managed Care, Other (non HMO) | Admitting: Primary Care

## 2016-10-24 ENCOUNTER — Ambulatory Visit (INDEPENDENT_AMBULATORY_CARE_PROVIDER_SITE_OTHER): Payer: Managed Care, Other (non HMO)

## 2016-10-24 ENCOUNTER — Encounter: Payer: Self-pay | Admitting: Podiatry

## 2016-10-24 ENCOUNTER — Ambulatory Visit (INDEPENDENT_AMBULATORY_CARE_PROVIDER_SITE_OTHER): Payer: Managed Care, Other (non HMO) | Admitting: Podiatry

## 2016-10-24 VITALS — BP 167/97 | HR 84

## 2016-10-24 DIAGNOSIS — M79673 Pain in unspecified foot: Secondary | ICD-10-CM

## 2016-10-24 DIAGNOSIS — M7660 Achilles tendinitis, unspecified leg: Secondary | ICD-10-CM

## 2016-10-24 DIAGNOSIS — R52 Pain, unspecified: Secondary | ICD-10-CM

## 2016-10-24 MED ORDER — MELOXICAM 15 MG PO TABS: 15.0000 mg | ORAL_TABLET | Freq: Every day | ORAL | 2 refills | Status: DC

## 2016-10-24 NOTE — Progress Notes (Signed)
   Subjective:    Patient ID: Susan Morales, female    DOB: [birth year], 46 y.o.   MRN: [id number]  HPI 46 year old female presents the op city concerns her right foot pain on the arch but she also points the outside aspect of the foot. She states this been ongoing for about 1 month and she states it started after she was painting angle up and down a ladder. She denies any specific injury or twisting. The pain is Somewhat Better but Does Continue. Denies Any Numbness or Tingling. The Pain Does Not Wake Her up at Night. She Has No Other Complaints Today.   Review of Systems  All other systems reviewed and are negative.      Objective:   Physical Exam General: AAO x3, NAD  Dermatological: Skin is warm, dry and supple bilateral. Nails x 10 are well manicured; remaining integument appears unremarkable at this time. There are no open sores, no preulcerative lesions, no rash or signs of infection present.  Vascular: Dorsalis Pedis artery and Posterior Tibial artery pedal pulses are 2/4 bilateral with immedate capillary fill time. Pedal hair growth present.  There is no pain with calf compression, swelling, warmth, erythema.   Neruologic: Grossly intact via light touch bilateral. Vibratory intact via tuning fork bilateral. Protective threshold with Semmes Wienstein monofilament intact to all pedal sites bilateral.   Musculoskeletal: There is mild tenderness to palpation on the arch of the foot along the mid substance of the plantar fascia. There is also tenderness which is mostly along the course/insertional peroneal tendinitis inferior to the lateral malleolus along the insertion of the fifth metatarsal base. Mild pain with inversion to lateral aspect of the foot. There is no area pinpoint bony tenderness or pain in vibratory sensation. Muscular strength 5/5 in all groups tested bilateral.  Gait: Unassisted, Nonantalgic.     Assessment & Plan:  46 year old female right foot pain likely  tendinitis/on her fascia -Treatment options discussed including all alternatives, risks, and complications -Etiology of symptoms were discussed -X-rays were obtained and reviewed with the patient. No evidence of acute fracture. Posterior heel spur as well as inferior spurring is present. Midfoot arthritis -Prescribed mobic. Discussed side effects of the medication and directed to stop if any are to occur and call the office.  -Rehab exercises -Shoe changes -Brace   Celesta Gentile, DPM

## 2016-10-24 NOTE — Patient Instructions (Signed)
Peroneal Tendinopathy Rehab Ask your health care provider which exercises are safe for you. Do exercises exactly as told by your health care provider and adjust them as directed. It is normal to feel mild stretching, pulling, tightness, or discomfort as you do these exercises, but you should stop right away if you feel sudden pain or your pain gets worse.Do not begin these exercises until told by your health care provider. Stretching and range of motion exercises These exercises warm up your muscles and joints and improve the movement and flexibility of your ankle. These exercises also help to relieve pain and stiffness. Exercise A: Gastroc and soleus, standing  1. Stand on the edge of a step on the balls of your feet. The ball of your foot is on the walking surface, right under your toes. 2. Hold onto the railing for balance. 3. Slowly lift your left / right foot, allowing your body weight to press your left / right heel down over the edge of the step. You should feel a stretch in your left / right calf. 4. Hold this position for __________ seconds. Repeat __________ times with your left / right knee straight and __________ times with your left / right knee bent. Complete this stretch __________ times per day. Strengthening exercises These exercises improve the strength and endurance of your foot and ankle. Endurance is the ability to use your muscles for a long time, even after they get tired. Exercise B: Dorsiflexors   1. Secure a rubber exercise band or tube to an object, like a table leg, that will not move if it is pulled on. 2. Secure the other end of the band around your left / right foot. 3. Sit on the floor, facing the object with your left / right foot extended. The band or tube should be slightly tense when your foot is relaxed. 4. Slowly flex your left / right ankle and toes to bring your foot toward you. 5. Hold this position for __________ seconds. 6. Slowly return your foot to the  starting position. Repeat __________ times. Complete this exercise __________ times per day. Exercise C: Evertors  1. Sit on the floor with your legs straight out in front of you. 2. Loop a rubber exercise or band or tube around the ball of your left / right foot. The ball of your foot is on the walking surface, right under your toes. 3. Hold the ends of the band in your hands, or secure the band to a stable object. 4. Slowly push your foot outward, away from your other leg. 5. Hold this position for __________ seconds. 6. Slowly return your foot to the starting position. Repeat __________ times. Complete this exercise __________ times per day. Exercise D: Standing heel raise (  plantar flexion) 1. Stand with your feet shoulder-width apart with the balls of your feet on a step. The ball of your foot is on the walking surface, right under your toes. 2. Keep your weight spread evenly over the width of your feet while you rise up on your toes. Use a wall or railing to steady yourself, but try not to use it for support. 3. If this exercise is too easy, try these options:  Shift your weight toward your left / right leg until you feel challenged.  If told by your health care provider, stand on your left / right leg only. 4. Hold this position for __________ seconds. Repeat __________ times. Complete this exercise __________ times per day. Exercise E:  Single leg stand 1. Without shoes, stand near a railing or in a doorway. You may hold onto the railing or door frame as needed. 2. Stand on your left / right foot. Keep your big toe down on the floor and try to keep your arch lifted.  Do not roll to the outside of your foot.  If this exercise is too easy, you can try it with your eyes closed or while standing on a pillow. 3. Hold this position for __________ seconds. Repeat __________ times. Complete this exercise __________ times per day. This information is not intended to replace advice given  to you by your health care provider. Make sure you discuss any questions you have with your health care provider. Document Released: 06/13/2005 Document Revised: 02/18/2016 Document Reviewed: 05/02/2015 Elsevier Interactive Patient Education  2017 [employer].

## 2016-10-26 ENCOUNTER — Ambulatory Visit (INDEPENDENT_AMBULATORY_CARE_PROVIDER_SITE_OTHER): Payer: Managed Care, Other (non HMO) | Admitting: Primary Care

## 2016-10-26 ENCOUNTER — Encounter: Payer: Self-pay | Admitting: Primary Care

## 2016-10-26 VITALS — BP 138/80 | HR 82 | Temp 98.6°F | Ht 64.0 in | Wt 272.8 lb

## 2016-10-26 DIAGNOSIS — M797 Fibromyalgia: Secondary | ICD-10-CM

## 2016-10-26 DIAGNOSIS — R7303 Prediabetes: Secondary | ICD-10-CM

## 2016-10-26 DIAGNOSIS — R5383 Other fatigue: Secondary | ICD-10-CM | POA: Diagnosis not present

## 2016-10-26 DIAGNOSIS — F329 Major depressive disorder, single episode, unspecified: Secondary | ICD-10-CM

## 2016-10-26 DIAGNOSIS — F419 Anxiety disorder, unspecified: Secondary | ICD-10-CM | POA: Diagnosis not present

## 2016-10-26 LAB — COMPREHENSIVE METABOLIC PANEL
ALT: 24 U/L (ref 0–35)
AST: 15 U/L (ref 0–37)
Albumin: 4.2 g/dL (ref 3.5–5.2)
Alkaline Phosphatase: 61 U/L (ref 39–117)
BUN: 16 mg/dL (ref 6–23)
CO2: 31 meq/L (ref 19–32)
Calcium: 9.7 mg/dL (ref 8.4–10.5)
Chloride: 101 mEq/L (ref 96–112)
Creatinine, Ser: 0.78 mg/dL (ref 0.40–1.20)
GFR: 84.12 mL/min (ref >60.00)
GLUCOSE: 100 mg/dL — AB (ref 70–99)
POTASSIUM: 3.9 meq/L (ref 3.5–5.1)
Sodium: 138 mEq/L (ref 135–145)
Total Bilirubin: 0.5 mg/dL (ref 0.2–1.2)
Total Protein: 7.7 g/dL (ref 6.0–8.3)

## 2016-10-26 LAB — CBC
HEMATOCRIT: 38.2 % (ref 36.0–46.0)
HEMOGLOBIN: 12.9 g/dL (ref 12.0–15.0)
MCHC: 33.8 g/dL (ref 30.0–36.0)
MCV: 87.3 fl (ref 78.0–100.0)
PLATELETS: 270 10*3/uL (ref 150.0–400.0)
RBC: 4.38 Mil/uL (ref 3.87–5.11)
RDW: 13.3 % (ref 11.5–15.5)
WBC: 9 10*3/uL (ref 4.0–10.5)

## 2016-10-26 LAB — TSH: TSH: 2.13 u[IU]/mL (ref 0.35–4.50)

## 2016-10-26 LAB — HEMOGLOBIN A1C: HEMOGLOBIN A1C: 5.9 % (ref 4.6–6.5)

## 2016-10-26 LAB — VITAMIN B12: Vitamin B-12: 637 pg/mL (ref 211–911)

## 2016-10-26 NOTE — Progress Notes (Signed)
Pre visit review using our clinic review tool, if applicable. No additional management support is needed unless otherwise documented below in the visit note. 

## 2016-10-26 NOTE — Assessment & Plan Note (Signed)
Denies concerns for either today. Continue fluoxetine 40 mg.

## 2016-10-26 NOTE — Patient Instructions (Signed)
Complete lab work prior to leaving today.   Start exercising. You should be getting 150 minutes of moderate intensity exercise weekly.  It's important to improve your diet by reducing consumption of fast food, fried food, processed snack foods. Increase consumption of fresh vegetables and fruits, whole grains, water.  Ensure you are drinking 64 ounces of water daily.  I'll be in touch with you soon regarding your results. It was a pleasure to see you today!

## 2016-10-26 NOTE — Assessment & Plan Note (Signed)
Likely secondary to fibromyalgia flare and obesity. Will check TSH, B12, CBC, CMP today to rule out metabolic cause. Discussed importance of regular exercise and healthy diet to improve fatigue and also for weight loss.  Doesn't seem to be depression based off of interview today. Will await lab results.

## 2016-10-26 NOTE — Progress Notes (Signed)
Subjective:    Patient ID: Susan Morales, female    DOB: 01-11-1971, 46 y.o.   MRN: [id number]  HPI  Susan Morales is a 46 year old female who presents today with multiple complaints.  1) Fibromyalgia/Arthralgias: History of fibromyalgia diagnosed years ago. Pain mostly located to shoulders, Posterior neck, hips, knees. This past weekend she felt joints were inflamed even though she had no evidence of swelling. She continues to gain weight, she also reports fatigue and has little energy to do anything on the weekends. She denies depression and anxiety and feels that she has overcome the loss of her mother one year ago. She is currently seeing podiatry for foot pain. She did start painting a lot in early April 2018 for which she thinks may have aggravated symptoms.   Diet currently consists of:  Breakfast: Eggs, smoothie Lunch: Salads (fast food) Dinner: [employer], fast food Snacks: None Desserts: None Beverages: Water, occasional diet Coke.  Exercise: She does not currently exercise.   2) Chronic Headaches: Originates to lower posterior neck with radiation of pain to the occipital lobe. Headaches occur weekly on average that has been present for 2-3 months. She's recently been taking Meloxicam without much improvement. She will take Tylenol, Advil, and Excedrin without much improvement. She does experience photophobia. Denies nausea. She describes her pain as a pulsating pressure.  Review of Systems  Constitutional: Positive for fatigue.  Eyes: Positive for photophobia.  Respiratory: Negative for shortness of breath.   Cardiovascular: Negative for chest pain.  Gastrointestinal: Negative for nausea.  Neurological: Positive for headaches.       Occasional numbness to upper extremities and feet.       Past Medical History:  Diagnosis Date  . Depression   . GERD (gastroesophageal reflux disease)   . Hemorrhoid   . UTI (urinary tract infection)      Social History   Social  History  . Marital status: Married    Spouse name: N/A  . Number of children: N/A  . Years of education: N/A   Occupational History  . Not on file.   Social History Main Topics  . Smoking status: Never Smoker  . Smokeless tobacco: Never Used  . Alcohol use No  . Drug use: No  . Sexual activity: Not on file   Other Topics Concern  . Not on file   Social History Narrative   Married.   4 children.   Works as a Print production planner.   Enjoys reading, spending time with her family.    Past Surgical History:  Procedure Laterality Date  . DIRECT LARYNGOSCOPY N/A 01/29/2013   Procedure: DIRECT LARYNGOSCOPY, esophagoscopy, bronchoscopy, ;  Surgeon: Ruby Cola, MD;  Location: [facility];  Service: ENT;  Laterality: N/A;    Family History  Problem Relation Age of Onset  . Adopted: Yes  . Other      Adopted    Allergies  Allergen Reactions  . Seasonal Ic [Cholestatin]     Current Outpatient Prescriptions on File Prior to Visit  Medication Sig Dispense Refill  . Ascorbic Acid (VITAMIN C PO) Take by mouth.    . Cyanocobalamin (VITAMIN B 12 PO) Take by mouth.    Marland Kitchen FLUoxetine (PROZAC) 40 MG capsule take 1 capsule by mouth once daily 90 capsule 1  . ibuprofen (ADVIL,MOTRIN) 200 MG tablet Take 200 mg by mouth every 6 (six) hours as needed for pain (pain). Reported on 07/09/2015    . MELATONIN PO Take by  mouth.    . meloxicam (MOBIC) 15 MG tablet Take 1 tablet (15 mg total) by mouth daily. 30 tablet 2   No current facility-administered medications on file prior to visit.     BP 138/80   Pulse 82   Temp 98.6 F (37 C) (Oral)   Ht 5\' 4"  (1.626 m)   Wt 272 lb 12.8 oz (123.7 kg)   SpO2 98%   BMI 46.83 kg/m    Objective:   Physical Exam  Constitutional: She appears well-nourished.  Eyes: EOM are normal.  Neck: Neck supple.  Cardiovascular: Normal rate and regular rhythm.   Pulmonary/Chest: Effort normal and breath sounds normal.  Musculoskeletal:       Arms: Muscular  tenderness  Neurological: No cranial nerve deficit.  Skin: Skin is warm and dry.  Psychiatric: She has a normal mood and affect.          Assessment & Plan:

## 2016-10-26 NOTE — Assessment & Plan Note (Signed)
Diagnosed years ago. Symptoms today could very well be contributed. No cervical spinal tenderness. Discussed importance of weight loss in order to reduce the burden of pressure on joints such as hips and knees and ankles. Strongly recommended she start exercising as this has been proven to improve fibromyalgia type symptoms.

## 2016-10-27 ENCOUNTER — Encounter: Payer: Self-pay | Admitting: Primary Care

## 2016-10-27 ENCOUNTER — Ambulatory Visit: Payer: Managed Care, Other (non HMO) | Admitting: Primary Care

## 2016-11-03 ENCOUNTER — Ambulatory Visit: Payer: Managed Care, Other (non HMO) | Admitting: Podiatry

## 2016-11-24 ENCOUNTER — Ambulatory Visit: Payer: Managed Care, Other (non HMO) | Admitting: Podiatry

## 2016-12-07 ENCOUNTER — Encounter: Payer: Self-pay | Admitting: Primary Care

## 2017-01-02 ENCOUNTER — Ambulatory Visit (INDEPENDENT_AMBULATORY_CARE_PROVIDER_SITE_OTHER): Payer: Managed Care, Other (non HMO) | Admitting: Primary Care

## 2017-01-02 ENCOUNTER — Encounter: Payer: Self-pay | Admitting: Primary Care

## 2017-01-02 VITALS — BP 122/72 | HR 83 | Temp 98.5°F | Ht 64.0 in | Wt 275.8 lb

## 2017-01-02 DIAGNOSIS — M797 Fibromyalgia: Secondary | ICD-10-CM | POA: Diagnosis not present

## 2017-01-02 DIAGNOSIS — F329 Major depressive disorder, single episode, unspecified: Secondary | ICD-10-CM

## 2017-01-02 DIAGNOSIS — F419 Anxiety disorder, unspecified: Secondary | ICD-10-CM | POA: Diagnosis not present

## 2017-01-02 DIAGNOSIS — M2550 Pain in unspecified joint: Secondary | ICD-10-CM | POA: Diagnosis not present

## 2017-01-02 DIAGNOSIS — R002 Palpitations: Secondary | ICD-10-CM

## 2017-01-02 NOTE — Assessment & Plan Note (Signed)
Weaned off of Prozac nearly one month ago, doing well without. She will continue to monitor symptoms.

## 2017-01-02 NOTE — Progress Notes (Signed)
Subjective:    Patient ID: Susan Morales, female    DOB: [birth year], 46 y.o.   MRN: [id number]  HPI  Susan Morales is a 46 year old female who presents today with multiple complaints.  1) Chronic Headaches: Present to the lower posterior neck with radiation to the bilateral occipital lobes. During her visit in May 2018 she endorsed a 2-3 month history of weekly headaches that had not responded to Meloxicam, Tylenol, Advil, Excedrin Migraine.   She weaned off of her Prozac early June and Meloxicam and noticed some improvement. She then purchased Flonase and hasn't had a headache since.  2) Arthralgias: History of fibromyalgia. Family history of rheumatoid arthritis in her birth mother. Symptoms of pain mostly to shoulders, but also to neck, hips, knees. She has noticed increased joint aches since returning from a beach trip 2 weeks ago.   She's also noticed some left sided chest pain and shortness of breath during this timeframe as well. She also noticed her heart palpitations that would occur with rest and exertion. Overall her chest pressure/pain is better. Her shortness of breath is present with normal house chores. She was climbing lighthouses during her beach trip without shortness of breath 2 weeks ago.   She denies radiation of pain, nausea, abdominal pain, diaphoresis.    Review of Systems  Constitutional: Positive for fatigue.  Respiratory: Positive for shortness of breath. Negative for cough.   Cardiovascular: Positive for chest pain and palpitations.  Musculoskeletal: Positive for arthralgias and myalgias.        Past Medical History:  Diagnosis Date  . Depression   . GERD (gastroesophageal reflux disease)   . Hemorrhoid   . UTI (urinary tract infection)      Social History   Social History  . Marital status: Married    Spouse name: N/A  . Number of children: N/A  . Years of education: N/A   Occupational History  . Not on file.   Social History Main Topics  .  Smoking status: Never Smoker  . Smokeless tobacco: Never Used  . Alcohol use No  . Drug use: No  . Sexual activity: Not on file   Other Topics Concern  . Not on file   Social History Narrative   Married.   4 children.   Works as a Print production planner.   Enjoys reading, spending time with her family.    Past Surgical History:  Procedure Laterality Date  . DIRECT LARYNGOSCOPY N/A 01/29/2013   Procedure: DIRECT LARYNGOSCOPY, esophagoscopy, bronchoscopy, ;  Surgeon: Ruby Cola, MD;  Location: [facility];  Service: ENT;  Laterality: N/A;    Family History  Problem Relation Age of Onset  . Adopted: Yes  . Other Unknown        Adopted    Allergies  Allergen Reactions  . Seasonal Ic [Cholestatin]     Current Outpatient Prescriptions on File Prior to Visit  Medication Sig Dispense Refill  . Ascorbic Acid (VITAMIN C PO) Take by mouth.    Marland Kitchen ibuprofen (ADVIL,MOTRIN) 200 MG tablet Take 200 mg by mouth every 6 (six) hours as needed for pain (pain). Reported on 07/09/2015    . MELATONIN PO Take by mouth.     No current facility-administered medications on file prior to visit.     BP 122/72   Pulse 83   Temp 98.5 F (36.9 C) (Oral)   Ht 5\' 4"  (1.626 m)   Wt 275 lb 12.8 oz (125.1 kg)  SpO2 98%   BMI 47.34 kg/m    Objective:   Physical Exam  Constitutional: She appears well-nourished.  Neck: Neck supple.  Cardiovascular: Normal rate and regular rhythm.   Pulmonary/Chest: Effort normal and breath sounds normal.  Musculoskeletal: Normal range of motion. She exhibits no edema or tenderness.  Skin: Skin is warm and dry.          Assessment & Plan:  Palpitations/Shortness of Breath:  Since beach trip 2 weeks ago, overall improved. Could be deconditioning due to obesity.  ECG today: NSR, rate of 85, no ST abnormality.  Lungs clear. Doesn't seem to be anxiety. Could be related to arthralgias. Check CBC, RF, ANA, Sed rate today. TSH in May unremarkable.  Sheral Flow, NP

## 2017-01-02 NOTE — Patient Instructions (Addendum)
Complete lab work prior to leaving today. I will notify you of your results once received. We will consider a rheumatology referral at that time.  The picture of your heart looks good.  It was a pleasure to see you today!

## 2017-01-02 NOTE — Assessment & Plan Note (Signed)
Increased pain to multiple joints since beach trip 2 weeks ago. Given symptoms and family history of RA, will check labs. Sed rate, RF, CBC, ANA pending. Consider rheumatology evaluation once labs have returned.

## 2017-01-03 ENCOUNTER — Encounter: Payer: Self-pay | Admitting: Primary Care

## 2017-01-03 ENCOUNTER — Other Ambulatory Visit: Payer: Self-pay | Admitting: Primary Care

## 2017-01-03 DIAGNOSIS — M2550 Pain in unspecified joint: Secondary | ICD-10-CM

## 2017-01-03 LAB — RHEUMATOID FACTOR: Rhuematoid fact SerPl-aCnc: <14 IU/mL (ref <14)

## 2017-01-03 LAB — CBC
HEMATOCRIT: 37.6 % (ref 36.0–46.0)
HEMOGLOBIN: 12.8 g/dL (ref 12.0–15.0)
MCHC: 34 g/dL (ref 30.0–36.0)
MCV: 86.3 fl (ref 78.0–100.0)
Platelets: 253 10*3/uL (ref 150.0–400.0)
RBC: 4.36 Mil/uL (ref 3.87–5.11)
RDW: 13.4 % (ref 11.5–15.5)
WBC: 10.3 10*3/uL (ref 4.0–10.5)

## 2017-01-03 LAB — ANA: Anti Nuclear Antibody(ANA): NEGATIVE

## 2017-01-03 LAB — SEDIMENTATION RATE: Sed Rate: 29 mm/hr — ABNORMAL HIGH (ref 0–20)

## 2017-01-04 NOTE — Telephone Encounter (Signed)
See My Chart message regarding Rheumatology. FYI.

## 2017-01-07 ENCOUNTER — Encounter: Payer: Self-pay | Admitting: Primary Care

## 2017-01-07 DIAGNOSIS — F329 Major depressive disorder, single episode, unspecified: Secondary | ICD-10-CM

## 2017-01-07 DIAGNOSIS — F32A Depression, unspecified: Secondary | ICD-10-CM

## 2017-01-07 DIAGNOSIS — F419 Anxiety disorder, unspecified: Principal | ICD-10-CM

## 2017-01-10 ENCOUNTER — Encounter: Payer: Self-pay | Admitting: Primary Care

## 2017-01-10 MED ORDER — FLUOXETINE HCL 20 MG PO TABS: 20.0000 mg | ORAL_TABLET | Freq: Every day | ORAL | 0 refills | Status: DC

## 2017-01-10 NOTE — Telephone Encounter (Signed)
Susan Morales, can you help get her in with someone else sooner? Do I need to place another referral?

## 2017-02-15 ENCOUNTER — Ambulatory Visit: Payer: Managed Care, Other (non HMO) | Admitting: Family Medicine

## 2017-02-15 ENCOUNTER — Encounter: Payer: Self-pay | Admitting: Family Medicine

## 2017-02-15 ENCOUNTER — Ambulatory Visit (INDEPENDENT_AMBULATORY_CARE_PROVIDER_SITE_OTHER): Payer: Managed Care, Other (non HMO) | Admitting: Family Medicine

## 2017-02-15 DIAGNOSIS — R42 Dizziness and giddiness: Secondary | ICD-10-CM

## 2017-02-15 NOTE — Assessment & Plan Note (Signed)
Resolving. Continue exercises and antivert as needed. Call or return to clinic prn if these symptoms worsen or fail to improve as anticipated. The patient indicates understanding of these issues and agrees with the plan.

## 2017-02-15 NOTE — Progress Notes (Signed)
Subjective:   Patient ID: Susan Morales, female    DOB: 12/06/1970, 46 y.o.   MRN: [id number]  Susan Morales is a pleasant 46 y.o. year old female who presents to clinic today with Dizziness (3 days ago went to [facility])  on 02/15/2017  HPI:  Dizziness-  Went to [facility] on 02/12/17 for dizziness.  Notes reviewed.  Acute onset of sensation of room spinning.  H/o of similar symptoms years ago when she was pregnant.  Felt symptoms were consistent with BPV and placed on OTC antivert and a decongestant.  She feels better today- feels the maneuvers she looked up online have helped the most.    Current Outpatient Prescriptions on File Prior to Visit  Medication Sig Dispense Refill  . Ascorbic Acid (VITAMIN C PO) Take by mouth.    . fluticasone (FLONASE) 50 MCG/ACT nasal spray Place 2 sprays into both nostrils daily.    Marland Kitchen ibuprofen (ADVIL,MOTRIN) 200 MG tablet Take 200 mg by mouth every 6 (six) hours as needed for pain (pain). Reported on 07/09/2015     No current facility-administered medications on file prior to visit.     Allergies  Allergen Reactions  . Seasonal Ic [Cholestatin]     Past Medical History:  Diagnosis Date  . Depression   . GERD (gastroesophageal reflux disease)   . Hemorrhoid   . UTI (urinary tract infection)     Past Surgical History:  Procedure Laterality Date  . DIRECT LARYNGOSCOPY N/A 01/29/2013   Procedure: DIRECT LARYNGOSCOPY, esophagoscopy, bronchoscopy, ;  Surgeon: Ruby Cola, MD;  Location: [facility];  Service: ENT;  Laterality: N/A;    Family History  Problem Relation Age of Onset  . Adopted: Yes  . Other Unknown        Adopted    Social History   Social History  . Marital status: Married    Spouse name: N/A  . Number of children: N/A  . Years of education: N/A   Occupational History  . Not on file.   Social History Main Topics  . Smoking status: Never Smoker  . Smokeless tobacco: Never Used  . Alcohol use  No  . Drug use: No  . Sexual activity: Not on file   Other Topics Concern  . Not on file   Social History Narrative   Married.   4 children.   Works as a Print production planner.   Enjoys reading, spending time with her family.   The PMH, PSH, Social History, Family History, Medications, and allergies have been reviewed in [facility], and have been updated if relevant.   Review of Systems  Constitutional: Negative.   HENT: Positive for congestion and sinus pressure.   Neurological: Positive for dizziness. Negative for tremors, seizures, syncope, facial asymmetry, speech difficulty, weakness, light-headedness, numbness and headaches.  All other systems reviewed and are negative.      Objective:    BP (!) 146/96   Pulse 89   Temp 98.4 F (36.9 C) (Oral)   Resp 16   Ht 5\' 4"  (1.626 m)   Wt 276 lb 12.8 oz (125.6 kg)   SpO2 98%   BMI 47.51 kg/m    Physical Exam  Constitutional: She is oriented to person, place, and time. She appears well-developed and well-nourished. No distress.  HENT:  Head: Normocephalic and atraumatic.  Eyes: Conjunctivae are normal.  Cardiovascular: Normal rate.   Pulmonary/Chest: Effort normal.  Musculoskeletal: Normal range of motion. She exhibits no edema.  Neurological: She is alert and oriented to person, place, and time. No cranial nerve deficit.  Skin: Skin is dry. She is not diaphoretic.  Psychiatric: She has a normal mood and affect. Her behavior is normal. Judgment and thought content normal.  Nursing note and vitals reviewed.         Assessment & Plan:   Vertigo No Follow-up on file.

## 2017-02-15 NOTE — Addendum Note (Signed)
Addended by: Lucille Passy on: 02/15/2017 10:17 AM   Modules accepted: Level of Service

## 2017-05-08 ENCOUNTER — Ambulatory Visit: Payer: Managed Care, Other (non HMO) | Admitting: Primary Care

## 2017-05-08 ENCOUNTER — Encounter: Payer: Self-pay | Admitting: Primary Care

## 2017-05-08 VITALS — BP 126/82 | HR 85 | Temp 98.2°F | Ht 64.0 in | Wt 274.8 lb

## 2017-05-08 DIAGNOSIS — E119 Type 2 diabetes mellitus without complications: Secondary | ICD-10-CM | POA: Insufficient documentation

## 2017-05-08 DIAGNOSIS — F419 Anxiety disorder, unspecified: Secondary | ICD-10-CM | POA: Diagnosis not present

## 2017-05-08 DIAGNOSIS — R7303 Prediabetes: Secondary | ICD-10-CM | POA: Diagnosis not present

## 2017-05-08 DIAGNOSIS — F329 Major depressive disorder, single episode, unspecified: Secondary | ICD-10-CM | POA: Diagnosis not present

## 2017-05-08 DIAGNOSIS — M546 Pain in thoracic spine: Secondary | ICD-10-CM | POA: Diagnosis not present

## 2017-05-08 LAB — BASIC METABOLIC PANEL
BUN: 11 mg/dL (ref 6–23)
CALCIUM: 9.7 mg/dL (ref 8.4–10.5)
CO2: 29 mEq/L (ref 19–32)
Chloride: 101 mEq/L (ref 96–112)
Creatinine, Ser: 0.71 mg/dL (ref 0.40–1.20)
GFR: 93.54 mL/min (ref >60.00)
Glucose, Bld: 123 mg/dL — ABNORMAL HIGH (ref 70–99)
Potassium: 4.8 mEq/L (ref 3.5–5.1)
SODIUM: 139 meq/L (ref 135–145)

## 2017-05-08 LAB — HEMOGLOBIN A1C: HEMOGLOBIN A1C: 5.8 % (ref 4.6–6.5)

## 2017-05-08 MED ORDER — CYCLOBENZAPRINE HCL 5 MG PO TABS: 5.0000 mg | ORAL_TABLET | Freq: Every evening | ORAL | 0 refills | Status: DC | PRN

## 2017-05-08 MED ORDER — FLUOXETINE HCL 20 MG PO TABS: 20.0000 mg | ORAL_TABLET | Freq: Every day | ORAL | 1 refills | Status: DC

## 2017-05-08 NOTE — Patient Instructions (Signed)
You may take the cyclobenzaprine 5 mg tablets (muscle relaxer) at bedtime as needed for muscle spasms/back pain. Caution as these may cause drowsiness.  Start using a heating pad in the evenings as needed.  Work on stretching the mid and lower back.  Please keep me updated on the fluoxetine dose.  It was a pleasure to see you today!

## 2017-05-08 NOTE — Assessment & Plan Note (Signed)
Noted on labs from May 2018, repeat today. Encouraged weight loss through regular exercise and healthy diet.

## 2017-05-08 NOTE — Progress Notes (Signed)
Subjective:    Patient ID: Susan Morales, female    DOB: 11-Oct-1970, 46 y.o.   MRN: [id number]  HPI  Susan Morales is a 46 year old female with a history of fibromyalgia who presents today with a chief complaint of back pain and a medication refill.   1) Back Pain: Her pain is located to the right thoracic spine that has been present for the past one month. Her pain will occur with positional changes after sitting for prolonged periods of time, and also in the morning when waking. Susan Morales describes her pain as "cramping/grabbing". Susan Morales's tried Advil without improvement. Susan Morales's not applied heating pads or completed stretching exercises. Susan Morales denies recent injury/trauma, numbness/tingling. Susan Morales is scheduled to see the rheumatologist next month.   Wt Readings from Last 3 Encounters:  05/08/17 274 lb 12.8 oz (124.6 kg)  02/15/17 276 lb 12.8 oz (125.6 kg)  01/02/17 275 lb 12.8 oz (125.1 kg)     2) Anxiety and Depression: Previously managed on fluoxetine 40 mg for which Susan Morales slowly weaned off herself in June 2018 as Susan Morales was feeling better. Around early September 2018 Susan Morales noticed symptoms of depression so Susan Morales restarted her fluoxetine at 20 mg at that time. Overall Susan Morales's noticed some improvement, is also taking two supplements OTC (devils claw and sunny mood). Susan Morales would like a refill of the fluoxetine. Susan Morales has noticed an increase in headaches since restarting, long history of chronic headaches dating back to teenage years.  Review of Systems  Musculoskeletal: Positive for back pain and myalgias.  Skin: Negative for color change.  Neurological: Positive for headaches. Negative for weakness and numbness.  Psychiatric/Behavioral:       Doing better since resuming fluoxetine.       Past Medical History:  Diagnosis Date  . Depression   . GERD (gastroesophageal reflux disease)   . Hemorrhoid   . UTI (urinary tract infection)      Social History   Socioeconomic History  . Marital status: Married   Spouse name: Not on file  . Number of children: Not on file  . Years of education: Not on file  . Highest education level: Not on file  Social Needs  . Financial resource strain: Not on file  . Food insecurity - worry: Not on file  . Food insecurity - inability: Not on file  . Transportation needs - medical: Not on file  . Transportation needs - non-medical: Not on file  Occupational History  . Not on file  Tobacco Use  . Smoking status: Never Smoker  . Smokeless tobacco: Never Used  Substance and Sexual Activity  . Alcohol use: No    Alcohol/week: 0.0 oz  . Drug use: No  . Sexual activity: Not on file  Other Topics Concern  . Not on file  Social History Narrative   Married.   4 children.   Works as a Print production planner.   Enjoys reading, spending time with her family.    No past surgical history on file.  Family History  Adopted: Yes  Problem Relation Age of Onset  . Other Unknown        Adopted    Allergies  Allergen Reactions  . Seasonal Ic [Cholestatin]     Current Outpatient Medications on File Prior to Visit  Medication Sig Dispense Refill  . Ascorbic Acid (VITAMIN C PO) Take by mouth.    . fluticasone (FLONASE) 50 MCG/ACT nasal spray Place 2 sprays into both nostrils daily.    Marland Kitchen  ibuprofen (ADVIL,MOTRIN) 200 MG tablet Take 200 mg by mouth every 6 (six) hours as needed for pain (pain). Reported on 07/09/2015     No current facility-administered medications on file prior to visit.     BP 126/82   Pulse 85   Temp 98.2 F (36.8 C) (Oral)   Ht 5\' 4"  (1.626 m)   Wt 274 lb 12.8 oz (124.6 kg)   SpO2 98%   BMI 47.17 kg/m    Objective:   Physical Exam  Constitutional: Susan Morales appears well-nourished.  Cardiovascular: Normal rate and regular rhythm.  Pulmonary/Chest: Effort normal and breath sounds normal.  Musculoskeletal:       Thoracic back: Susan Morales exhibits normal range of motion, no tenderness, no bony tenderness and no spasm.       Back:  Intermittent  pain to area mention in drawing. Non tender.   Skin: Skin is warm and dry.          Assessment & Plan:  Acute Thoracic Back Pain:  Seems to me MSK involvement based off of HPI and exam. Good ROM. In no distress. No color change. Will have her try heating pads and stretching.  Rx for low dose Flexeril sent to pharmacy, drowsiness precautions provided. Susan Morales will see rheumatology in December 2018.  Sheral Flow, NP

## 2017-05-08 NOTE — Assessment & Plan Note (Signed)
Refilled fluoxetine at 20 mg. She will continue to update.

## 2017-07-20 ENCOUNTER — Ambulatory Visit: Payer: 59 | Admitting: Psychology

## 2017-07-20 ENCOUNTER — Encounter: Payer: Self-pay | Admitting: Primary Care

## 2017-07-20 DIAGNOSIS — F329 Major depressive disorder, single episode, unspecified: Secondary | ICD-10-CM

## 2017-07-20 DIAGNOSIS — F411 Generalized anxiety disorder: Secondary | ICD-10-CM

## 2017-07-20 DIAGNOSIS — F419 Anxiety disorder, unspecified: Principal | ICD-10-CM

## 2017-07-21 MED ORDER — FLUOXETINE HCL 40 MG PO CAPS
40.0000 mg | ORAL_CAPSULE | Freq: Every day | ORAL | 0 refills | Status: DC
Start: 2017-07-21 — End: 2017-10-16

## 2017-08-03 ENCOUNTER — Ambulatory Visit: Payer: 59 | Admitting: Psychology

## 2017-08-04 ENCOUNTER — Encounter: Payer: Self-pay | Admitting: Family Medicine

## 2017-08-04 ENCOUNTER — Ambulatory Visit: Payer: Managed Care, Other (non HMO) | Admitting: Family Medicine

## 2017-08-04 VITALS — BP 128/80 | HR 100 | Temp 98.5°F | Wt 276.8 lb

## 2017-08-04 DIAGNOSIS — J0100 Acute maxillary sinusitis, unspecified: Secondary | ICD-10-CM

## 2017-08-04 MED ORDER — AMOXICILLIN-POT CLAVULANATE 875-125 MG PO TABS: 1.0000 | ORAL_TABLET | Freq: Two times a day (BID) | ORAL | 0 refills | Status: DC

## 2017-08-04 NOTE — Patient Instructions (Signed)
For nasal congestion you can use Afrin nasal spray for 3 days max, Sudafed, saline nasal spray (generic is fine for all). For cough you can try Delsym. Drink enough fluids to make your urine light yellow. For fever/chill/muscle aches you can take over the counter acetaminophen or ibuprofen.  Please come back in if you are not better in 5-7 days or if you develop wheezing, shortness of breath or persistent vomiting.   

## 2017-08-04 NOTE — Progress Notes (Signed)
   Subjective:    Patient ID: Susan Morales, female    DOB: 29-Oct-1970, 47 y.o.   MRN: [id number]  HPI This is a 47 yo female who presents today with 3+ weeks of uri symptoms. Has been having night time cough, no chest congestion, sinuses burning, little nasal drainage, left ear pain, no sore throat. No fever. No SOB or wheeze. Took some left over amoxicillin at beginning of illness.  Has been taking mucinex with little relief. Alka seltzer with little relief.     Past Medical History:  Diagnosis Date  . Depression   . GERD (gastroesophageal reflux disease)   . Hemorrhoid   . UTI (urinary tract infection)    Past Surgical History:  Procedure Laterality Date  . DIRECT LARYNGOSCOPY N/A 01/29/2013   Procedure: DIRECT LARYNGOSCOPY, esophagoscopy, bronchoscopy, ;  Surgeon: Ruby Cola, MD;  Location: [facility];  Service: ENT;  Laterality: N/A;   Family History  Adopted: Yes  Problem Relation Age of Onset  . Other Unknown        Adopted   Social History   Tobacco Use  . Smoking status: Never Smoker  . Smokeless tobacco: Never Used  Substance Use Topics  . Alcohol use: No    Alcohol/week: 0.0 oz  . Drug use: No      Review of Systems Per HPI    Objective:   Physical Exam  Constitutional: She is oriented to person, place, and time. She appears well-developed and well-nourished. No distress.  HENT:  Head: Normocephalic and atraumatic.  Right Ear: Tympanic membrane, external ear and ear canal normal.  Left Ear: Tympanic membrane, external ear and ear canal normal.  Nose: Mucosal edema present.  Mouth/Throat: Uvula is midline and mucous membranes are normal. Posterior oropharyngeal erythema present. No oropharyngeal exudate or posterior oropharyngeal edema.  Eyes: Conjunctivae are normal.  Neck: Normal range of motion. Neck supple.  Cardiovascular: Normal rate, regular rhythm and normal heart sounds.  Pulmonary/Chest: Effort normal and breath sounds normal.    Lymphadenopathy:    She has no cervical adenopathy.  Neurological: She is alert and oriented to person, place, and time.  Skin: Skin is warm and dry. She is not diaphoretic.  Psychiatric: She has a normal mood and affect. Her behavior is normal. Judgment and thought content normal.  Vitals reviewed.     BP 128/80   Pulse 100   Temp 98.5 F (36.9 C) (Oral)   Wt 276 lb 12 oz (125.5 kg)   SpO2 98%   BMI 47.50 kg/m      Assessment & Plan:  1. Acute non-recurrent maxillary sinusitis - Provided written and verbal information regarding diagnosis and treatment. - given wait and see antibiotic if not improved in 3-4 days -  Patient Instructions  For nasal congestion you can use Afrin nasal spray for 3 days max, Sudafed, saline nasal spray (generic is fine for all). For cough you can try Delsym. Drink enough fluids to make your urine light yellow. For fever/chill/muscle aches you can take over the counter acetaminophen or ibuprofen.  Please come back in if you are not better in 5-7 days or if you develop wheezing, shortness of breath or persistent vomiting.     - amoxicillin-clavulanate (AUGMENTIN) 875-125 MG tablet; Take 1 tablet by mouth 2 (two) times daily.  Dispense: 14 tablet; Refill: 0   Clarene Reamer, FNP-BC  [facility]  08/04/2017 2:25 PM

## 2017-08-17 ENCOUNTER — Ambulatory Visit: Payer: 59 | Admitting: Psychology

## 2017-08-21 ENCOUNTER — Encounter: Payer: Self-pay | Admitting: Primary Care

## 2017-08-21 ENCOUNTER — Ambulatory Visit: Payer: Managed Care, Other (non HMO) | Admitting: Primary Care

## 2017-08-21 VITALS — BP 152/82 | HR 92 | Temp 98.0°F | Ht 64.0 in | Wt 273.2 lb

## 2017-08-21 DIAGNOSIS — J029 Acute pharyngitis, unspecified: Secondary | ICD-10-CM

## 2017-08-21 DIAGNOSIS — J3489 Other specified disorders of nose and nasal sinuses: Secondary | ICD-10-CM | POA: Diagnosis not present

## 2017-08-21 DIAGNOSIS — J069 Acute upper respiratory infection, unspecified: Secondary | ICD-10-CM

## 2017-08-21 LAB — POCT RAPID STREP A (OFFICE): RAPID STREP A SCREEN: NEGATIVE

## 2017-08-21 MED ORDER — PREDNISONE 10 MG PO TABS: ORAL_TABLET | ORAL | 0 refills | Status: DC

## 2017-08-21 NOTE — Progress Notes (Signed)
Subjective:    Patient ID: Susan Morales, female    DOB: 28-Aug-1970, 47 y.o.   MRN: [id number]  HPI  Susan Morales is a 47 year old female who presents today with a chief complaint of nasal congestion. She also reports cough, sore throat, chest congestion, sinus pressure.  Her symptoms have been present since early February 2019, improved for a while, then returned four days ago.   She was last evaluated on 08/04/17 with a three week history of URI symptoms. She was diagnosed with acute bacterial sinusitis and provided with a prescription for Augmentin to fill several days later if no improvement in symptoms.   She did fill the prescription for Augmentin and completed the full course, doesn't feel like she ever completely recovered. She's been exposed to influenza at school one week ago. She's not checking her temperature at home, doesn't feel like she's been running a fever. She's been taking Sudafed, using OTC Afrin nasal spray (multiple days) with some improvement.   Review of Systems  Constitutional: Positive for chills. Negative for fatigue and fever.  HENT: Positive for congestion, sinus pressure and sore throat.   Respiratory: Positive for cough.        Past Medical History:  Diagnosis Date  . Depression   . GERD (gastroesophageal reflux disease)   . Hemorrhoid   . UTI (urinary tract infection)      Social History   Socioeconomic History  . Marital status: Married    Spouse name: Not on file  . Number of children: Not on file  . Years of education: Not on file  . Highest education level: Not on file  Social Needs  . Financial resource strain: Not on file  . Food insecurity - worry: Not on file  . Food insecurity - inability: Not on file  . Transportation needs - medical: Not on file  . Transportation needs - non-medical: Not on file  Occupational History  . Not on file  Tobacco Use  . Smoking status: Never Smoker  . Smokeless tobacco: Never Used  Substance and  Sexual Activity  . Alcohol use: No    Alcohol/week: 0.0 oz  . Drug use: No  . Sexual activity: Not on file  Other Topics Concern  . Not on file  Social History Narrative   Married.   4 children.   Works as a Print production planner.   Enjoys reading, spending time with her family.    Past Surgical History:  Procedure Laterality Date  . DIRECT LARYNGOSCOPY N/A 01/29/2013   Procedure: DIRECT LARYNGOSCOPY, esophagoscopy, bronchoscopy, ;  Surgeon: Ruby Cola, MD;  Location: [facility];  Service: ENT;  Laterality: N/A;    Family History  Adopted: Yes  Problem Relation Age of Onset  . Other Unknown        Adopted    Allergies  Allergen Reactions  . Seasonal Ic [Cholestatin]     Current Outpatient Medications on File Prior to Visit  Medication Sig Dispense Refill  . Ascorbic Acid (VITAMIN C PO) Take by mouth.    Marland Kitchen FLUoxetine (PROZAC) 40 MG capsule Take 1 capsule (40 mg total) by mouth daily. 90 capsule 0  . fluticasone (FLONASE) 50 MCG/ACT nasal spray Place 2 sprays into both nostrils daily.    Marland Kitchen ibuprofen (ADVIL,MOTRIN) 200 MG tablet Take 200 mg by mouth every 6 (six) hours as needed for pain (pain). Reported on 07/09/2015     No current facility-administered medications on file prior to visit.  BP (!) 152/82   Pulse 92   Temp 98 F (36.7 C) (Oral)   Ht 5\' 4"  (1.626 m)   Wt 273 lb 4 oz (123.9 kg)   SpO2 98%   BMI 46.90 kg/m    Objective:   Physical Exam  Constitutional: She appears well-nourished.  HENT:  Right Ear: Tympanic membrane and ear canal normal.  Left Ear: Tympanic membrane and ear canal normal.  Nose: Mucosal edema present. Right sinus exhibits frontal sinus tenderness. Right sinus exhibits no maxillary sinus tenderness. Left sinus exhibits frontal sinus tenderness. Left sinus exhibits no maxillary sinus tenderness.  Mouth/Throat: Posterior oropharyngeal erythema present.  Eyes: Conjunctivae are normal.  Neck: Neck supple.  Cardiovascular: Normal rate and  regular rhythm.  Pulmonary/Chest: Effort normal and breath sounds normal. She has no wheezes. She has no rales.  Lymphadenopathy:    She has no cervical adenopathy.  Skin: Skin is warm and dry.          Assessment & Plan:  URI:  Sore throat, cough, congestion, sinus pressure x 4 days. Temporary improvement with OTC. Rapid Strep: Negative Do suspect viral component based off of HPI, presentation, and recent antibiotic treatment. Discussed to stop Afrin.  Rx for prednisone course provided. Delsym or Robitussin PRN cough, Tylenol PRN sore throat.  Fluids, rest, follow up PRN.  Pleas Koch, NP

## 2017-08-21 NOTE — Patient Instructions (Signed)
Start prednisone tablets. Take three tablets for 2 days, then two tablets for 2 days, then one tablet for 2 days.  Use Delsym or Robitussin as needed for cough.  You may take Tylenol as needed for sore throat.  Stop Afrin. Start Flonase after completion of prednisone.   It was a pleasure to see you today!

## 2017-08-31 ENCOUNTER — Ambulatory Visit: Payer: 59 | Admitting: Psychology

## 2017-09-14 ENCOUNTER — Ambulatory Visit: Payer: 59 | Admitting: Psychology

## 2017-10-16 ENCOUNTER — Other Ambulatory Visit: Payer: Self-pay | Admitting: Primary Care

## 2017-10-16 DIAGNOSIS — F419 Anxiety disorder, unspecified: Principal | ICD-10-CM

## 2017-10-16 DIAGNOSIS — F329 Major depressive disorder, single episode, unspecified: Secondary | ICD-10-CM

## 2017-10-16 DIAGNOSIS — F32A Depression, unspecified: Secondary | ICD-10-CM

## 2017-10-26 ENCOUNTER — Ambulatory Visit: Payer: Managed Care, Other (non HMO) | Admitting: Primary Care

## 2017-11-24 ENCOUNTER — Other Ambulatory Visit: Payer: Self-pay | Admitting: Primary Care

## 2017-11-24 DIAGNOSIS — Z1231 Encounter for screening mammogram for malignant neoplasm of breast: Secondary | ICD-10-CM

## 2017-12-13 ENCOUNTER — Ambulatory Visit
Admission: RE | Admit: 2017-12-13 | Discharge: 2017-12-13 | Disposition: A | Discharge status: Home / Self Care | Payer: Managed Care, Other (non HMO) | Source: Ambulatory Visit | Attending: Primary Care | Admitting: Primary Care

## 2017-12-13 DIAGNOSIS — Z1231 Encounter for screening mammogram for malignant neoplasm of breast: Secondary | ICD-10-CM

## 2017-12-14 ENCOUNTER — Other Ambulatory Visit: Payer: Self-pay | Admitting: Primary Care

## 2017-12-14 DIAGNOSIS — R928 Other abnormal and inconclusive findings on diagnostic imaging of breast: Secondary | ICD-10-CM

## 2017-12-18 ENCOUNTER — Other Ambulatory Visit: Payer: Self-pay | Admitting: Primary Care

## 2017-12-18 ENCOUNTER — Ambulatory Visit
Admission: RE | Admit: 2017-12-18 | Discharge: 2017-12-18 | Disposition: A | Discharge status: Home / Self Care | Payer: Managed Care, Other (non HMO) | Source: Ambulatory Visit | Attending: Primary Care | Admitting: Primary Care

## 2017-12-18 DIAGNOSIS — R928 Other abnormal and inconclusive findings on diagnostic imaging of breast: Secondary | ICD-10-CM

## 2017-12-18 DIAGNOSIS — N6310 Unspecified lump in the right breast, unspecified quadrant: Secondary | ICD-10-CM

## 2018-01-03 ENCOUNTER — Encounter: Payer: Self-pay | Admitting: Family Medicine

## 2018-01-03 ENCOUNTER — Ambulatory Visit: Payer: Managed Care, Other (non HMO) | Admitting: Family Medicine

## 2018-01-03 VITALS — BP 158/94 | HR 98 | Temp 98.9°F | Ht 64.0 in | Wt 274.0 lb

## 2018-01-03 DIAGNOSIS — S30861A Insect bite (nonvenomous) of abdominal wall, initial encounter: Secondary | ICD-10-CM | POA: Diagnosis not present

## 2018-01-03 DIAGNOSIS — W57XXXA Bitten or stung by nonvenomous insect and other nonvenomous arthropods, initial encounter: Secondary | ICD-10-CM | POA: Diagnosis not present

## 2018-01-03 MED ORDER — DOXYCYCLINE HYCLATE 100 MG PO CAPS: 100.0000 mg | ORAL_CAPSULE | Freq: Two times a day (BID) | ORAL | 0 refills | Status: DC

## 2018-01-03 NOTE — Progress Notes (Signed)
   Subjective:    Patient ID: Susan Morales, female    DOB: November 27, 1970, 47 y.o.   MRN: [id number]  HPI This is a 47 yo female who presents today with concern for tick bite. Removed a tiny, engorged tick from her left side a week ago. Yesterday she woke up yesterday with a terrible headache, took some Motrin and felt better, had fever 99.8 later in the day. Shoulders, toes and fingers are achy.  Elevated blood pressure today and last visit. She reports that she has had several normal readings in between, feels like it is elevated because she feels bad today.   Past Medical History:  Diagnosis Date  . Depression   . GERD (gastroesophageal reflux disease)   . Hemorrhoid   . UTI (urinary tract infection)    Past Surgical History:  Procedure Laterality Date  . DIRECT LARYNGOSCOPY N/A 01/29/2013   Procedure: DIRECT LARYNGOSCOPY, esophagoscopy, bronchoscopy, ;  Surgeon: Ruby Cola, MD;  Location: [facility];  Service: ENT;  Laterality: N/A;   Family History  Adopted: Yes  Problem Relation Age of Onset  . Other Unknown        Adopted   Social History   Tobacco Use  . Smoking status: Never Smoker  . Smokeless tobacco: Never Used  Substance Use Topics  . Alcohol use: No    Alcohol/week: 0.0 oz  . Drug use: No      Review of Systems Per HPI    Objective:   Physical Exam  Constitutional: She is oriented to person, place, and time. She appears well-developed and well-nourished. No distress.  HENT:  Head: Normocephalic and atraumatic.  Eyes: Conjunctivae are normal.  Cardiovascular: Normal rate.  Pulmonary/Chest: Effort normal.  Neurological: She is alert and oriented to person, place, and time.  Skin: Skin is warm and dry. She is not diaphoretic.  Right side of abdomen with small area erythema. No drainage.   Psychiatric: She has a normal mood and affect. Her behavior is normal. Judgment and thought content normal.  Vitals reviewed.     BP (!) 158/94 (BP Location: Right  Arm, Patient Position: Sitting, Cuff Size: Large)   Pulse 98   Temp 98.9 F (37.2 C) (Oral)   Ht 5\' 4"  (1.626 m)   Wt 274 lb (124.3 kg)   SpO2 97%   BMI 47.03 kg/m  Wt Readings from Last 3 Encounters:  01/03/18 274 lb (124.3 kg)  08/21/17 273 lb 4 oz (123.9 kg)  08/04/17 276 lb 12 oz (125.5 kg)   BP Readings from Last 3 Encounters:  01/03/18 (!) 158/94  08/21/17 (!) 152/82  08/04/17 128/80       Assessment & Plan:  1. Tick bite of abdominal wall, initial encounter - will go ahead and treat for tick born illness given low grade fever, headaches, myalgias - RTC precautions reviewed - doxycycline (VIBRAMYCIN) 100 MG capsule; Take 1 capsule (100 mg total) by mouth 2 (two) times daily.  Dispense: 14 capsule; Refill: 0- discussed side effects and encouraged her to take with food and full glass of water   Clarene Reamer, FNP-BC  [facility], [place]  01/03/2018 2:24 PM

## 2018-01-03 NOTE — Patient Instructions (Signed)

## 2018-02-12 ENCOUNTER — Ambulatory Visit (HOSPITAL_BASED_OUTPATIENT_CLINIC_OR_DEPARTMENT_OTHER): Admit: 2018-02-12 | Payer: Managed Care, Other (non HMO) | Admitting: Obstetrics and Gynecology

## 2018-02-12 ENCOUNTER — Encounter (HOSPITAL_BASED_OUTPATIENT_CLINIC_OR_DEPARTMENT_OTHER): Payer: Self-pay

## 2018-02-12 SURGERY — HYSTERECTOMY, VAGINAL, LAPAROSCOPY-ASSISTED, WITH SALPINGECTOMY
Anesthesia: General | Laterality: Bilateral

## 2018-02-14 ENCOUNTER — Other Ambulatory Visit: Payer: Self-pay | Admitting: Obstetrics and Gynecology

## 2018-02-14 DIAGNOSIS — Z803 Family history of malignant neoplasm of breast: Secondary | ICD-10-CM

## 2018-03-01 ENCOUNTER — Ambulatory Visit: Payer: Managed Care, Other (non HMO) | Admitting: Primary Care

## 2018-03-01 ENCOUNTER — Encounter: Payer: Self-pay | Admitting: Primary Care

## 2018-03-01 VITALS — BP 126/80 | HR 77 | Temp 98.1°F | Ht 64.0 in | Wt 282.5 lb

## 2018-03-01 DIAGNOSIS — R05 Cough: Secondary | ICD-10-CM | POA: Diagnosis not present

## 2018-03-01 DIAGNOSIS — K219 Gastro-esophageal reflux disease without esophagitis: Secondary | ICD-10-CM | POA: Diagnosis not present

## 2018-03-01 DIAGNOSIS — R053 Chronic cough: Secondary | ICD-10-CM | POA: Insufficient documentation

## 2018-03-01 MED ORDER — OMEPRAZOLE 40 MG PO CPDR: 40.0000 mg | DELAYED_RELEASE_CAPSULE | Freq: Every day | ORAL | 0 refills | Status: DC

## 2018-03-01 NOTE — Progress Notes (Signed)
Subjective:    Patient ID: Susan Morales, female    DOB: Jan 17, 1971, 47 y.o.   MRN: [id number]  HPI  Ms. Rottman is a 47 year old female with a history of GERD who presents today with a chief complaint of cough.  She also reports voice hoarseness. Her cough is present mostly during the morning, feels "crackley" in the neck and is bringing up a whitish/clear color in the morning. The cough improves throughout the day and is gone by mid afternoon. This has been present for the last 4-6 months, worse recently.   She denies fevers, chills, fatigue. She was placed on a one week course of Doxycycline in July this year for a tick bite, then treated with another antibiotic by his dentist for a dental/gum infection just one week later.  She has a history of GERD and is taking OTC Prilosec. She does experience breakthrough symptoms of esophageal burning. She's using Flonase intermittently without much improvement.   Review of Systems  Constitutional: Negative for fatigue and fever.  HENT: Positive for congestion and postnasal drip. Negative for ear pain and sore throat.   Respiratory: Positive for cough. Negative for shortness of breath and wheezing.   Gastrointestinal:       GERD       Past Medical History:  Diagnosis Date  . Depression   . GERD (gastroesophageal reflux disease)   . Hemorrhoid   . UTI (urinary tract infection)      Social History   Socioeconomic History  . Marital status: Married    Spouse name: Not on file  . Number of children: Not on file  . Years of education: Not on file  . Highest education level: Not on file  Occupational History  . Not on file  Social Needs  . Financial resource strain: Not on file  . Food insecurity:    Worry: Not on file    Inability: Not on file  . Transportation needs:    Medical: Not on file    Non-medical: Not on file  Tobacco Use  . Smoking status: Never Smoker  . Smokeless tobacco: Never Used  Substance and Sexual Activity   . Alcohol use: No    Alcohol/week: 0.0 standard drinks  . Drug use: No  . Sexual activity: Not on file  Lifestyle  . Physical activity:    Days per week: Not on file    Minutes per session: Not on file  . Stress: Not on file  Relationships  . Social connections:    Talks on phone: Not on file    Gets together: Not on file    Attends religious service: Not on file    Active member of club or organization: Not on file    Attends meetings of clubs or organizations: Not on file    Relationship status: Not on file  . Intimate partner violence:    Fear of current or ex partner: Not on file    Emotionally abused: Not on file    Physically abused: Not on file    Forced sexual activity: Not on file  Other Topics Concern  . Not on file  Social History Narrative   Married.   4 children.   Works as a Print production planner.   Enjoys reading, spending time with her family.    Past Surgical History:  Procedure Laterality Date  . DIRECT LARYNGOSCOPY N/A 01/29/2013   Procedure: DIRECT LARYNGOSCOPY, esophagoscopy, bronchoscopy, ;  Surgeon: Ruby Cola, MD;  Location: [facility];  Service: ENT;  Laterality: N/A;    Family History  Adopted: Yes  Problem Relation Age of Onset  . Other Unknown        Adopted    Allergies  Allergen Reactions  . Seasonal Ic [Cholestatin]     Current Outpatient Medications on File Prior to Visit  Medication Sig Dispense Refill  . Ascorbic Acid (VITAMIN C PO) Take by mouth.    Marland Kitchen FLUoxetine (PROZAC) 40 MG capsule TAKE 1 CAPSULE BY MOUTH EVERY DAY 90 capsule 1  . fluticasone (FLONASE) 50 MCG/ACT nasal spray Place 2 sprays into both nostrils daily.    Marland Kitchen ibuprofen (ADVIL,MOTRIN) 200 MG tablet Take 200 mg by mouth every 6 (six) hours as needed for pain (pain). Reported on 07/09/2015     No current facility-administered medications on file prior to visit.     BP 126/80   Pulse 77   Temp 98.1 F (36.7 C) (Oral)   Ht 5\' 4"  (1.626 m)   Wt 282 lb 8 oz (128.1 kg)    SpO2 98%   BMI 48.49 kg/m    Objective:   Physical Exam  Constitutional: She appears well-nourished. She does not appear ill.  HENT:  Right Ear: Tympanic membrane and ear canal normal.  Left Ear: Tympanic membrane and ear canal normal.  Nose: No mucosal edema. Right sinus exhibits no maxillary sinus tenderness and no frontal sinus tenderness. Left sinus exhibits no maxillary sinus tenderness and no frontal sinus tenderness.  Mouth/Throat: Oropharynx is clear and moist.  Neck: Neck supple.  Cardiovascular: Normal rate and regular rhythm.  Respiratory: Effort normal and breath sounds normal. She has no wheezes.  Skin: Skin is warm and dry.           Assessment & Plan:

## 2018-03-01 NOTE — Assessment & Plan Note (Signed)
Ongoing for 4-6 months. Exam today unremarkable. Highly doubt infection based off of normal exam and the fact that she's had two rounds of antibiotics within the last 2 months. Suspect GERD during the night when sleeping.  Rx for omeprazole 40 mg sent to pharmacy. She will update in 2 weeks. If no improvement then consider adding daily antihistamine.

## 2018-03-01 NOTE — Patient Instructions (Signed)
We've increased your omeprazole (Prilosec) dose to 40 mg. Take 1 capsule by mouth once daily.  Avoid trigger foods for heartburn. Also avoid laying down for bed within 2 hours after eating dinner.  Please update me in 2 weeks.  It was a pleasure to see you today!   Food Choices for Gastroesophageal Reflux Disease, Adult When you have gastroesophageal reflux disease (GERD), the foods you eat and your eating habits are very important. Choosing the right foods can help ease your discomfort. What guidelines do I need to follow?  Choose fruits, vegetables, whole grains, and low-fat dairy products.  Choose low-fat meat, fish, and poultry.  Limit fats such as oils, salad dressings, butter, nuts, and avocado.  Keep a food diary. This helps you identify foods that cause symptoms.  Avoid foods that cause symptoms. These may be different for everyone.  Eat small meals often instead of 3 large meals a day.  Eat your meals slowly, in a place where you are relaxed.  Limit fried foods.  Cook foods using methods other than frying.  Avoid drinking alcohol.  Avoid drinking large amounts of liquids with your meals.  Avoid bending over or lying down until 2-3 hours after eating. What foods are not recommended? These are some foods and drinks that may make your symptoms worse: Vegetables Tomatoes. Tomato juice. Tomato and spaghetti sauce. Chili peppers. Onion and garlic. Horseradish. Fruits Oranges, grapefruit, and lemon (fruit and juice). Meats High-fat meats, fish, and poultry. This includes hot dogs, ribs, ham, sausage, salami, and bacon. Dairy Whole milk and chocolate milk. Sour cream. Cream. Butter. Ice cream. Cream cheese. Drinks Coffee and tea. Bubbly (carbonated) drinks or energy drinks. Condiments Hot sauce. Barbecue sauce. Sweets/Desserts Chocolate and cocoa. Donuts. Peppermint and spearmint. Fats and Oils High-fat foods. This includes [place] fries and potato  chips. Other Vinegar. Strong spices. This includes black pepper, white pepper, red pepper, cayenne, curry powder, cloves, ginger, and chili powder. The items listed above may not be a complete list of foods and drinks to avoid. Contact your dietitian for more information. This information is not intended to replace advice given to you by your health care provider. Make sure you discuss any questions you have with your health care provider. Document Released: 12/13/2011 Document Revised: 11/19/2015 Document Reviewed: 04/17/2013 Elsevier Interactive Patient Education  2017 [employer].

## 2018-03-15 ENCOUNTER — Ambulatory Visit: Payer: 59 | Admitting: Psychology

## 2018-03-15 DIAGNOSIS — F4323 Adjustment disorder with mixed anxiety and depressed mood: Secondary | ICD-10-CM

## 2018-04-12 ENCOUNTER — Ambulatory Visit: Payer: 59 | Admitting: Psychology

## 2018-04-12 DIAGNOSIS — F4322 Adjustment disorder with anxiety: Secondary | ICD-10-CM

## 2018-04-26 ENCOUNTER — Ambulatory Visit: Payer: 59 | Admitting: Psychology

## 2018-04-26 DIAGNOSIS — F4322 Adjustment disorder with anxiety: Secondary | ICD-10-CM

## 2018-05-22 ENCOUNTER — Ambulatory Visit: Payer: 59 | Admitting: Psychology

## 2018-06-02 ENCOUNTER — Other Ambulatory Visit: Payer: Self-pay | Admitting: Primary Care

## 2018-06-02 DIAGNOSIS — K219 Gastro-esophageal reflux disease without esophagitis: Secondary | ICD-10-CM

## 2018-06-02 DIAGNOSIS — R05 Cough: Secondary | ICD-10-CM

## 2018-06-02 DIAGNOSIS — R053 Chronic cough: Secondary | ICD-10-CM

## 2018-06-04 NOTE — Telephone Encounter (Signed)
Last prescribed on 03/01/2018 Last office visit on 03/01/2018

## 2018-06-04 NOTE — Telephone Encounter (Signed)
Mychart message sent to patient.

## 2018-06-05 NOTE — Telephone Encounter (Signed)
Per DPR, left detail message of Tawni Millers comments for patient to call back or reply to MyChart message

## 2018-06-05 NOTE — Telephone Encounter (Signed)
Will you please call patient, she has not responded to my chart message?

## 2018-06-12 ENCOUNTER — Other Ambulatory Visit: Payer: Self-pay | Admitting: Primary Care

## 2018-06-12 DIAGNOSIS — F419 Anxiety disorder, unspecified: Principal | ICD-10-CM

## 2018-06-12 DIAGNOSIS — F329 Major depressive disorder, single episode, unspecified: Secondary | ICD-10-CM

## 2018-06-14 ENCOUNTER — Other Ambulatory Visit: Payer: Self-pay | Admitting: Primary Care

## 2018-06-14 DIAGNOSIS — F329 Major depressive disorder, single episode, unspecified: Secondary | ICD-10-CM

## 2018-06-14 DIAGNOSIS — F419 Anxiety disorder, unspecified: Principal | ICD-10-CM

## 2018-06-21 ENCOUNTER — Ambulatory Visit: Payer: 59 | Admitting: Psychology

## 2018-07-05 ENCOUNTER — Ambulatory Visit: Payer: 59 | Admitting: Psychology

## 2018-07-05 ENCOUNTER — Ambulatory Visit
Admission: RE | Admit: 2018-07-05 | Discharge: 2018-07-05 | Disposition: A | Discharge status: Home / Self Care | Payer: Managed Care, Other (non HMO) | Source: Ambulatory Visit | Attending: Primary Care | Admitting: Primary Care

## 2018-07-05 DIAGNOSIS — N6310 Unspecified lump in the right breast, unspecified quadrant: Secondary | ICD-10-CM

## 2018-07-19 ENCOUNTER — Ambulatory Visit: Payer: 59 | Admitting: Psychology

## 2018-07-19 DIAGNOSIS — F4323 Adjustment disorder with mixed anxiety and depressed mood: Secondary | ICD-10-CM

## 2018-08-02 ENCOUNTER — Ambulatory Visit: Payer: 59 | Admitting: Psychology

## 2018-08-02 DIAGNOSIS — F4323 Adjustment disorder with mixed anxiety and depressed mood: Secondary | ICD-10-CM | POA: Diagnosis not present

## 2018-08-16 ENCOUNTER — Ambulatory Visit: Payer: 59 | Admitting: Psychology

## 2018-08-30 ENCOUNTER — Ambulatory Visit: Payer: 59 | Admitting: Psychology

## 2018-08-30 DIAGNOSIS — F330 Major depressive disorder, recurrent, mild: Secondary | ICD-10-CM

## 2018-08-30 DIAGNOSIS — F411 Generalized anxiety disorder: Secondary | ICD-10-CM | POA: Diagnosis not present

## 2018-09-11 ENCOUNTER — Ambulatory Visit: Payer: 59 | Admitting: Psychology

## 2018-09-12 DIAGNOSIS — F419 Anxiety disorder, unspecified: Principal | ICD-10-CM

## 2018-09-12 DIAGNOSIS — F32A Depression, unspecified: Secondary | ICD-10-CM

## 2018-09-12 DIAGNOSIS — F329 Major depressive disorder, single episode, unspecified: Secondary | ICD-10-CM

## 2018-09-13 ENCOUNTER — Ambulatory Visit: Payer: 59 | Admitting: Psychology

## 2018-09-14 MED ORDER — FLUOXETINE HCL 20 MG PO TABS: 40.0000 mg | ORAL_TABLET | Freq: Every day | ORAL | 1 refills | Status: DC

## 2018-09-14 MED ORDER — HYDROXYZINE HCL 10 MG PO TABS: 10.0000 mg | ORAL_TABLET | Freq: Two times a day (BID) | ORAL | 0 refills | Status: DC | PRN

## 2018-09-27 ENCOUNTER — Ambulatory Visit: Payer: 59 | Admitting: Psychology

## 2018-10-11 ENCOUNTER — Ambulatory Visit: Payer: 59 | Admitting: Psychology

## 2018-10-25 ENCOUNTER — Ambulatory Visit: Payer: 59 | Admitting: Psychology

## 2018-10-26 ENCOUNTER — Telehealth: Payer: Self-pay | Admitting: Primary Care

## 2018-10-26 NOTE — Telephone Encounter (Signed)
Patient due for CPE with labs, we can do this virtually. Will you please schedule?

## 2018-11-06 NOTE — Telephone Encounter (Signed)
Called to schedule. Pt states she will call when she gets home and can look at her calendar.

## 2018-11-22 ENCOUNTER — Ambulatory Visit: Payer: 59 | Admitting: Psychology

## 2018-12-04 ENCOUNTER — Encounter (HOSPITAL_BASED_OUTPATIENT_CLINIC_OR_DEPARTMENT_OTHER): Payer: Self-pay | Admitting: *Deleted

## 2018-12-06 ENCOUNTER — Ambulatory Visit: Payer: 59 | Admitting: Psychology

## 2018-12-11 NOTE — Patient Instructions (Addendum)
Susan Morales       \Your procedure is scheduled on 12-17-2018              YOUR COVID 19 TEST IS SCHEDULED FOR 12-13-18 @.. ONCE YOUR TEST IS COMPLETED, PLEASE BEGIN THE QUARANTINE INSTRUCTIONS AS OUTLINED IN YOUR HANDOUT.   Report to [facility].   Call this number if you have problems the morning of [phone]   OUR ADDRESS IS [address], WE ARE LOCATED IN THE [facility] WITH [facility].   Remember:  NO SOLID FOOD AFTER MIDNIGHT THE NIGHT PRIOR TO SURGERY. NOTHING BY MOUTH EXCEPT CLEAR LIQUIDS UNTIL 430 am.. PLEASE FINISH ENSURE DRINK PER SURGEON ORDER WHICH NEEDS TO BE COMPLETED AT 430 am.     CLEAR LIQUID DIET   Foods Allowed                                                                     Foods Excluded  Coffee and tea, regular and decaf                             liquids that you cannot  Plain Jell-O in any flavor                                             see through such as: Fruit ices (not with fruit pulp)                                     milk, soups, orange juice  Iced Popsicles                                    All solid food Carbonated beverages, regular and diet                                    Cranberry, grape and apple juices Sports drinks like Gatorade Lightly seasoned clear broth or consume(fat free) Sugar, honey syrup  Sample Menu Breakfast                                Lunch                                     Supper Cranberry juice                    Beef broth                            Chicken broth Jell-O  Grape juice                           Apple juice Coffee or tea                        Jell-O                                      Popsicle                                                Coffee or tea                        Coffee or tea  _____________________________________________________________________    Take these  medicines the morning of surgery with A SIP OF WATER:  CERTRIZINE (ZYRTEC)   Do not wear jewelry, make-up or nail polish.  Do not wear lotions, powders, or perfumes, or deoderant.  Do not shave 48 hours prior to surgery.    Do not bring valuables to the hospital.  [facility] is not responsible for any belongings or valuables.  Contacts, dentures or bridgework may not be worn into surgery.  Leave your suitcase in the car.  After surgery it may be brought to your room.  For patients admitted to the hospital, discharge time will be determined by your treatment team.  Patients discharged the day of surgery will not be allowed to drive home.   Special instructions:  PLEASE BRING ALL  PRESCRIPTION MEDICATIONS IN  THEIR ORIGINAL CONTAINERS, NO VISITORS ARE ALLOWED TO SPEND THE NIGHT.  Please read over the following fact sheets that you were given:   [facility] - Preparing for Surgery Before surgery, you can play an important role.  Because skin is not sterile, your skin needs to be as free of germs as possible.  You can reduce the number of germs on your skin by washing with CHG (chlorahexidine gluconate) soap before surgery.  CHG is an antiseptic cleaner which kills germs and bonds with the skin to continue killing germs even after washing. Please DO NOT use if you have an allergy to CHG or antibacterial soaps.  If your skin becomes reddened/irritated stop using the CHG and inform your nurse when you arrive at Short Stay. Do not shave (including legs and underarms) for at least 48 hours prior to the first CHG shower.  You may shave your face/neck. Please follow these instructions carefully:  1.  Shower with CHG Soap the night before surgery and the  morning of Surgery.  2.  If you choose to wash your hair, wash your hair first as usual with your  normal  shampoo.  3.  After you shampoo, rinse your hair and body thoroughly to remove the  shampoo.                           4.  Use CHG as you would  any other liquid soap.  You can apply chg directly  to the skin and wash  Gently with a scrungie or clean washcloth.  5.  Apply the CHG Soap to your body ONLY FROM THE NECK DOWN.   Do not use on face/ open                           Wound or open sores. Avoid contact with eyes, ears mouth and genitals (private parts).                       Wash face,  Genitals (private parts) with your normal soap.             6.  Wash thoroughly, paying special attention to the area where your surgery  will be performed.  7.  Thoroughly rinse your body with warm water from the neck down.  8.  DO NOT shower/wash with your normal soap after using and rinsing off  the CHG Soap.                9.  Pat yourself dry with a clean towel.            10.  Wear clean pajamas.            11.  Place clean sheets on your bed the night of your first shower and do not  sleep with pets. Day of Surgery : Do not apply any lotions/deodorants the morning of surgery.  Please wear clean clothes to the hospital/surgery center.  FAILURE TO FOLLOW THESE INSTRUCTIONS MAY RESULT IN THE CANCELLATION OF YOUR SURGERY PATIENT SIGNATURE_________________________________  NURSE SIGNATURE__________________________________  ________________________________________________________________________  WHAT IS A BLOOD TRANSFUSION? Blood Transfusion Information  A transfusion is the replacement of blood or some of its parts. Blood is made up of multiple cells which provide different functions.  Red blood cells carry oxygen and are used for blood loss replacement.  White blood cells fight against infection.  Platelets control bleeding.  Plasma helps clot blood.  Other blood products are available for specialized needs, such as hemophilia or other clotting disorders. BEFORE THE TRANSFUSION  Who gives blood for transfusions?   Healthy volunteers who are fully evaluated to make sure their blood is safe. This is blood bank  blood. Transfusion therapy is the safest it has ever been in the practice of medicine. Before blood is taken from a donor, a complete history is taken to make sure that person has no history of diseases nor engages in risky social behavior (examples are intravenous drug use or sexual activity with multiple partners). The donor's travel history is screened to minimize risk of transmitting infections, such as malaria. The donated blood is tested for signs of infectious diseases, such as HIV and hepatitis. The blood is then tested to be sure it is compatible with you in order to minimize the chance of a transfusion reaction. If you or a relative donates blood, this is often done in anticipation of surgery and is not appropriate for emergency situations. It takes many days to process the donated blood. RISKS AND COMPLICATIONS Although transfusion therapy is very safe and saves many lives, the main dangers of transfusion include:   Getting an infectious disease.  Developing a transfusion reaction. This is an allergic reaction to something in the blood you were given. Every precaution is taken to prevent this. The decision to have a blood transfusion has been considered carefully by your caregiver before blood is given. Blood is not given unless the benefits outweigh  the risks. AFTER THE TRANSFUSION  Right after receiving a blood transfusion, you will usually feel much better and more energetic. This is especially true if your red blood cells have gotten low (anemic). The transfusion raises the level of the red blood cells which carry oxygen, and this usually causes an energy increase.  The nurse administering the transfusion will monitor you carefully for complications. HOME CARE INSTRUCTIONS  No special instructions are needed after a transfusion. You may find your energy is better. Speak with your caregiver about any limitations on activity for underlying diseases you may have. SEEK MEDICAL CARE IF:    Your condition is not improving after your transfusion.  You develop redness or irritation at the intravenous (IV) site. SEEK IMMEDIATE MEDICAL CARE IF:  Any of the following symptoms occur over the next 12 hours:  Shaking chills.  You have a temperature by mouth above 102 F (38.9 C), not controlled by medicine.  Chest, back, or muscle pain.  People around you feel you are not acting correctly or are confused.  Shortness of breath or difficulty breathing.  Dizziness and fainting.  You get a rash or develop hives.  You have a decrease in urine output.  Your urine turns a dark color or changes to pink, red, or brown. Any of the following symptoms occur over the next 10 days:  You have a temperature by mouth above 102 F (38.9 C), not controlled by medicine.  Shortness of breath.  Weakness after normal activity.  The white part of the eye turns yellow (jaundice).  You have a decrease in the amount of urine or are urinating less often.  Your urine turns a dark color or changes to pink, red, or brown. Document Released: 06/10/2000 Document Revised: 09/05/2011 Document Reviewed: 01/28/2008 [facility] Patient Information 2014 [place], [place].  _______________________________________________________________________

## 2018-12-12 ENCOUNTER — Encounter (HOSPITAL_COMMUNITY)
Admission: RE | Admit: 2018-12-12 | Discharge: 2018-12-12 | Disposition: A | Discharge status: Home / Self Care | Payer: Managed Care, Other (non HMO) | Source: Ambulatory Visit | Attending: Obstetrics and Gynecology | Admitting: Obstetrics and Gynecology

## 2018-12-12 ENCOUNTER — Other Ambulatory Visit: Payer: Self-pay

## 2018-12-12 ENCOUNTER — Encounter (HOSPITAL_COMMUNITY): Payer: Self-pay

## 2018-12-12 DIAGNOSIS — Z01812 Encounter for preprocedural laboratory examination: Secondary | ICD-10-CM | POA: Diagnosis present

## 2018-12-12 HISTORY — DX: Other complications of anesthesia, initial encounter: T88.59XA

## 2018-12-12 LAB — BASIC METABOLIC PANEL
Anion gap: 10 (ref 5–15)
BUN: 12 mg/dL (ref 6–20)
CO2: 27 mmol/L (ref 22–32)
Calcium: 9.2 mg/dL (ref 8.9–10.3)
Chloride: 102 mmol/L (ref 98–111)
Creatinine, Ser: 0.75 mg/dL (ref 0.44–1.00)
GFR calc Af Amer: >60 mL/min (ref >60)
GFR calc non Af Amer: >60 mL/min (ref >60)
Glucose, Bld: 114 mg/dL — ABNORMAL HIGH (ref 70–99)
Potassium: 4.6 mmol/L (ref 3.5–5.1)
Sodium: 139 mmol/L (ref 135–145)

## 2018-12-12 LAB — CBC
HCT: 42.1 % (ref 36.0–46.0)
Hemoglobin: 13.6 g/dL (ref 12.0–15.0)
MCH: 28.5 pg (ref 26.0–34.0)
MCHC: 32.3 g/dL (ref 30.0–36.0)
MCV: 88.1 fL (ref 80.0–100.0)
Platelets: 271 10*3/uL (ref 150–400)
RBC: 4.78 MIL/uL (ref 3.87–5.11)
RDW: 12.9 % (ref 11.5–15.5)
WBC: 7.3 10*3/uL (ref 4.0–10.5)
nRBC: 0 % (ref 0.0–0.2)

## 2018-12-12 LAB — HEMOGLOBIN A1C
Hgb A1c MFr Bld: 5.7 % — ABNORMAL HIGH (ref 4.8–5.6)
Mean Plasma Glucose: 116.89 mg/dL

## 2018-12-12 LAB — ABO/RH: ABO/RH(D): A POS

## 2018-12-13 ENCOUNTER — Other Ambulatory Visit (HOSPITAL_COMMUNITY)
Admission: RE | Admit: 2018-12-13 | Discharge: 2018-12-13 | Disposition: A | Discharge status: Home / Self Care | Payer: Managed Care, Other (non HMO) | Source: Ambulatory Visit | Attending: Obstetrics and Gynecology | Admitting: Obstetrics and Gynecology

## 2018-12-13 DIAGNOSIS — Z01812 Encounter for preprocedural laboratory examination: Secondary | ICD-10-CM | POA: Diagnosis not present

## 2018-12-14 LAB — SARS CORONAVIRUS 2 (TAT 6-24 HRS): SARS Coronavirus 2: NEGATIVE

## 2018-12-17 ENCOUNTER — Ambulatory Visit (HOSPITAL_BASED_OUTPATIENT_CLINIC_OR_DEPARTMENT_OTHER): Payer: Managed Care, Other (non HMO) | Admitting: Physician Assistant

## 2018-12-17 ENCOUNTER — Other Ambulatory Visit: Payer: Self-pay

## 2018-12-17 ENCOUNTER — Encounter (HOSPITAL_BASED_OUTPATIENT_CLINIC_OR_DEPARTMENT_OTHER): Payer: Self-pay

## 2018-12-17 ENCOUNTER — Encounter (HOSPITAL_BASED_OUTPATIENT_CLINIC_OR_DEPARTMENT_OTHER)
Admission: RE | Disposition: A | Discharge status: Home / Self Care | Payer: Self-pay | Source: Home / Self Care | Attending: Obstetrics and Gynecology

## 2018-12-17 ENCOUNTER — Ambulatory Visit (HOSPITAL_BASED_OUTPATIENT_CLINIC_OR_DEPARTMENT_OTHER)
Admission: RE | Admit: 2018-12-17 | Discharge: 2018-12-18 | Disposition: A | Discharge status: Home / Self Care | Payer: Managed Care, Other (non HMO) | Attending: Obstetrics and Gynecology | Admitting: Obstetrics and Gynecology

## 2018-12-17 ENCOUNTER — Ambulatory Visit (HOSPITAL_BASED_OUTPATIENT_CLINIC_OR_DEPARTMENT_OTHER): Payer: Managed Care, Other (non HMO) | Admitting: Anesthesiology

## 2018-12-17 DIAGNOSIS — N8003 Adenomyosis of the uterus: Secondary | ICD-10-CM | POA: Diagnosis present

## 2018-12-17 DIAGNOSIS — R102 Pelvic and perineal pain: Secondary | ICD-10-CM

## 2018-12-17 DIAGNOSIS — K219 Gastro-esophageal reflux disease without esophagitis: Secondary | ICD-10-CM | POA: Diagnosis not present

## 2018-12-17 DIAGNOSIS — F329 Major depressive disorder, single episode, unspecified: Secondary | ICD-10-CM | POA: Insufficient documentation

## 2018-12-17 DIAGNOSIS — D259 Leiomyoma of uterus, unspecified: Secondary | ICD-10-CM | POA: Diagnosis not present

## 2018-12-17 DIAGNOSIS — N946 Dysmenorrhea, unspecified: Secondary | ICD-10-CM | POA: Insufficient documentation

## 2018-12-17 DIAGNOSIS — N800 Endometriosis of uterus: Secondary | ICD-10-CM

## 2018-12-17 HISTORY — DX: Pelvic and perineal pain: R10.2

## 2018-12-17 HISTORY — DX: Endometriosis of uterus: N80.0

## 2018-12-17 HISTORY — PX: LAPAROSCOPIC VAGINAL HYSTERECTOMY WITH SALPINGECTOMY: SHX6680

## 2018-12-17 HISTORY — DX: Adenomyosis of the uterus: N80.03

## 2018-12-17 LAB — TYPE AND SCREEN
ABO/RH(D): A POS
Antibody Screen: NEGATIVE

## 2018-12-17 LAB — POCT PREGNANCY, URINE: Preg Test, Ur: NEGATIVE

## 2018-12-17 SURGERY — HYSTERECTOMY, VAGINAL, LAPAROSCOPY-ASSISTED, WITH SALPINGECTOMY
Anesthesia: General | Site: Abdomen | Laterality: Bilateral

## 2018-12-17 MED ORDER — MENTHOL 3 MG MT LOZG
1.0000 | LOZENGE | OROMUCOSAL | Status: DC | PRN
  Filled 2018-12-17: qty 9

## 2018-12-17 MED ORDER — PROPOFOL 10 MG/ML IV BOLUS
INTRAVENOUS | Status: AC
  Filled 2018-12-17: qty 60

## 2018-12-17 MED ORDER — SODIUM CHLORIDE 0.9 % IV SOLN
INTRAVENOUS | Status: AC
  Filled 2018-12-17: qty 2

## 2018-12-17 MED ORDER — OXYCODONE-ACETAMINOPHEN 5-325 MG PO TABS
2.0000 | ORAL_TABLET | ORAL | Status: DC | PRN
  Administered 2018-12-17: 20:00:00 2 via ORAL
  Filled 2018-12-17: qty 2

## 2018-12-17 MED ORDER — LACTATED RINGERS IV SOLN
INTRAVENOUS | Status: DC
  Filled 2018-12-17: qty 1000

## 2018-12-17 MED ORDER — MIDAZOLAM HCL 5 MG/5ML IJ SOLN
INTRAMUSCULAR | Status: DC | PRN
  Administered 2018-12-17: 2 mg via INTRAVENOUS

## 2018-12-17 MED ORDER — ONDANSETRON HCL 4 MG/2ML IJ SOLN
4.0000 mg | Freq: Once | INTRAMUSCULAR | Status: DC | PRN
  Filled 2018-12-17: qty 2

## 2018-12-17 MED ORDER — KETOROLAC TROMETHAMINE 30 MG/ML IJ SOLN
INTRAMUSCULAR | Status: DC | PRN
  Administered 2018-12-17: 30 mg via INTRAVENOUS

## 2018-12-17 MED ORDER — FENTANYL CITRATE (PF) 100 MCG/2ML IJ SOLN
25.0000 ug | INTRAMUSCULAR | Status: DC | PRN
  Administered 2018-12-17: 25 ug via INTRAVENOUS
  Administered 2018-12-17: 10:00:00 50 ug via INTRAVENOUS
  Filled 2018-12-17: qty 1

## 2018-12-17 MED ORDER — OXYCODONE-ACETAMINOPHEN 5-325 MG PO TABS
ORAL_TABLET | ORAL | Status: AC
  Filled 2018-12-17: qty 1

## 2018-12-17 MED ORDER — OXYCODONE HCL 5 MG PO TABS
5.0000 mg | ORAL_TABLET | Freq: Once | ORAL | Status: DC | PRN
  Filled 2018-12-17: qty 1

## 2018-12-17 MED ORDER — ONDANSETRON HCL 4 MG/2ML IJ SOLN
INTRAMUSCULAR | Status: AC
  Filled 2018-12-17: qty 2

## 2018-12-17 MED ORDER — ONDANSETRON HCL 4 MG/2ML IJ SOLN
INTRAMUSCULAR | Status: DC | PRN
  Administered 2018-12-17: 4 mg via INTRAVENOUS

## 2018-12-17 MED ORDER — ONDANSETRON HCL 4 MG/2ML IJ SOLN
4.0000 mg | Freq: Four times a day (QID) | INTRAMUSCULAR | Status: DC | PRN
  Filled 2018-12-17: qty 2

## 2018-12-17 MED ORDER — FENTANYL CITRATE (PF) 100 MCG/2ML IJ SOLN
INTRAMUSCULAR | Status: DC | PRN
  Administered 2018-12-17 (×2): 50 ug via INTRAVENOUS
  Administered 2018-12-17: 100 ug via INTRAVENOUS
  Administered 2018-12-17: 50 ug via INTRAVENOUS

## 2018-12-17 MED ORDER — TRAMADOL HCL 50 MG PO TABS
50.0000 mg | ORAL_TABLET | Freq: Four times a day (QID) | ORAL | Status: DC | PRN
  Filled 2018-12-17: qty 1

## 2018-12-17 MED ORDER — FENTANYL CITRATE (PF) 100 MCG/2ML IJ SOLN
INTRAMUSCULAR | Status: AC
  Filled 2018-12-17: qty 2

## 2018-12-17 MED ORDER — OXYCODONE HCL 5 MG PO TABS
ORAL_TABLET | ORAL | Status: AC
  Filled 2018-12-17: qty 1

## 2018-12-17 MED ORDER — PHENYLEPHRINE 40 MCG/ML (10ML) SYRINGE FOR IV PUSH (FOR BLOOD PRESSURE SUPPORT)
PREFILLED_SYRINGE | INTRAVENOUS | Status: AC
  Filled 2018-12-17: qty 10

## 2018-12-17 MED ORDER — KETOROLAC TROMETHAMINE 30 MG/ML IJ SOLN
INTRAMUSCULAR | Status: AC
  Filled 2018-12-17: qty 1

## 2018-12-17 MED ORDER — SODIUM CHLORIDE 0.9 % IV SOLN
2.0000 g | INTRAVENOUS | Status: AC
  Administered 2018-12-17: 07:00:00 2 g via INTRAVENOUS
  Filled 2018-12-17: qty 2

## 2018-12-17 MED ORDER — FLUOXETINE HCL 20 MG PO TABS
40.0000 mg | ORAL_TABLET | Freq: Every day | ORAL | Status: DC
  Administered 2018-12-17: 22:00:00 40 mg via ORAL
  Filled 2018-12-17 (×3): qty 2

## 2018-12-17 MED ORDER — SODIUM CHLORIDE 0.9 % IR SOLN
Status: DC | PRN
  Administered 2018-12-17: 3000 mL

## 2018-12-17 MED ORDER — ONDANSETRON HCL 4 MG PO TABS
4.0000 mg | ORAL_TABLET | Freq: Four times a day (QID) | ORAL | Status: DC | PRN
  Filled 2018-12-17: qty 1

## 2018-12-17 MED ORDER — DEXAMETHASONE SODIUM PHOSPHATE 10 MG/ML IJ SOLN
INTRAMUSCULAR | Status: AC
  Filled 2018-12-17: qty 1

## 2018-12-17 MED ORDER — OXYCODONE HCL 5 MG/5ML PO SOLN
5.0000 mg | Freq: Once | ORAL | Status: DC | PRN
  Filled 2018-12-17: qty 5

## 2018-12-17 MED ORDER — PHENYLEPHRINE 40 MCG/ML (10ML) SYRINGE FOR IV PUSH (FOR BLOOD PRESSURE SUPPORT)
PREFILLED_SYRINGE | INTRAVENOUS | Status: DC | PRN
  Administered 2018-12-17 (×2): 120 ug via INTRAVENOUS
  Administered 2018-12-17 (×4): 80 ug via INTRAVENOUS

## 2018-12-17 MED ORDER — DEXAMETHASONE SODIUM PHOSPHATE 10 MG/ML IJ SOLN
INTRAMUSCULAR | Status: DC | PRN
  Administered 2018-12-17: 10 mg via INTRAVENOUS

## 2018-12-17 MED ORDER — LACTATED RINGERS IV SOLN
INTRAVENOUS | Status: DC
  Administered 2018-12-17: 1000 mL via INTRAVENOUS
  Administered 2018-12-17: 09:00:00 via INTRAVENOUS
  Filled 2018-12-17: qty 1000

## 2018-12-17 MED ORDER — ROCURONIUM BROMIDE 50 MG/5ML IV SOSY
PREFILLED_SYRINGE | INTRAVENOUS | Status: DC | PRN
  Administered 2018-12-17: 20 mg via INTRAVENOUS
  Administered 2018-12-17: 55 mg via INTRAVENOUS

## 2018-12-17 MED ORDER — MIDAZOLAM HCL 2 MG/2ML IJ SOLN
INTRAMUSCULAR | Status: AC
  Filled 2018-12-17: qty 2

## 2018-12-17 MED ORDER — MENTHOL 3 MG MT LOZG
LOZENGE | OROMUCOSAL | Status: AC
  Filled 2018-12-17: qty 9

## 2018-12-17 MED ORDER — HYDROMORPHONE HCL 1 MG/ML IJ SOLN
0.2000 mg | INTRAMUSCULAR | Status: DC | PRN
  Filled 2018-12-17: qty 1

## 2018-12-17 MED ORDER — LIDOCAINE 2% (20 MG/ML) 5 ML SYRINGE
INTRAMUSCULAR | Status: AC
  Filled 2018-12-17: qty 5

## 2018-12-17 MED ORDER — IBUPROFEN 200 MG PO TABS
ORAL_TABLET | ORAL | Status: AC
  Filled 2018-12-17: qty 3

## 2018-12-17 MED ORDER — SUGAMMADEX SODIUM 200 MG/2ML IV SOLN
INTRAVENOUS | Status: AC
  Filled 2018-12-17: qty 4

## 2018-12-17 MED ORDER — KETOROLAC TROMETHAMINE 30 MG/ML IJ SOLN
30.0000 mg | Freq: Once | INTRAMUSCULAR | Status: DC
  Filled 2018-12-17: qty 1

## 2018-12-17 MED ORDER — FENTANYL CITRATE (PF) 250 MCG/5ML IJ SOLN
INTRAMUSCULAR | Status: AC
  Filled 2018-12-17: qty 5

## 2018-12-17 MED ORDER — SUGAMMADEX SODIUM 200 MG/2ML IV SOLN
INTRAVENOUS | Status: DC | PRN
  Administered 2018-12-17: 260 mg via INTRAVENOUS

## 2018-12-17 MED ORDER — BUPIVACAINE HCL (PF) 0.25 % IJ SOLN
INTRAMUSCULAR | Status: DC | PRN
  Administered 2018-12-17: 5 mL

## 2018-12-17 MED ORDER — PROPOFOL 10 MG/ML IV BOLUS
INTRAVENOUS | Status: DC | PRN
  Administered 2018-12-17: 180 mg via INTRAVENOUS

## 2018-12-17 MED ORDER — LIDOCAINE 2% (20 MG/ML) 5 ML SYRINGE
INTRAMUSCULAR | Status: DC | PRN
  Administered 2018-12-17: 80 mg via INTRAVENOUS

## 2018-12-17 MED ORDER — IBUPROFEN 600 MG PO TABS
600.0000 mg | ORAL_TABLET | Freq: Four times a day (QID) | ORAL | Status: DC | PRN
  Administered 2018-12-17: 15:00:00 600 mg via ORAL
  Filled 2018-12-17: qty 1

## 2018-12-17 MED ORDER — SIMETHICONE 80 MG PO CHEW
80.0000 mg | CHEWABLE_TABLET | Freq: Four times a day (QID) | ORAL | Status: DC | PRN
  Filled 2018-12-17: qty 1

## 2018-12-17 MED ORDER — OXYCODONE HCL 5 MG PO TABS
5.0000 mg | ORAL_TABLET | ORAL | Status: DC | PRN
  Administered 2018-12-17 – 2018-12-18 (×4): 5 mg via ORAL
  Filled 2018-12-17: qty 2

## 2018-12-17 MED ORDER — ROCURONIUM BROMIDE 10 MG/ML (PF) SYRINGE
PREFILLED_SYRINGE | INTRAVENOUS | Status: AC
  Filled 2018-12-17: qty 10

## 2018-12-17 SURGICAL SUPPLY — 61 items
ADH SKN CLS APL DERMABOND .7 (GAUZE/BANDAGES/DRESSINGS) ×1
APL SRG 38 LTWT LNG FL B (MISCELLANEOUS)
APPLICATOR ARISTA FLEXITIP XL (MISCELLANEOUS) IMPLANT
BARRIER ADHS 3X4 INTERCEED (GAUZE/BANDAGES/DRESSINGS) IMPLANT
BLADE CLIPPER SENSICLIP SURGIC (BLADE) IMPLANT
BRR ADH 4X3 ABS CNTRL BYND (GAUZE/BANDAGES/DRESSINGS)
CANISTER SUCT 3000ML PPV (MISCELLANEOUS) IMPLANT
COVER BACK TABLE 80X110 HD (DRAPES) ×3 IMPLANT
COVER MAYO STAND STRL (DRAPES) ×6 IMPLANT
DERMABOND ADVANCED (GAUZE/BANDAGES/DRESSINGS) ×2
DERMABOND ADVANCED .7 DNX12 (GAUZE/BANDAGES/DRESSINGS) ×1 IMPLANT
DRSG COVADERM PLUS 2X2 (GAUZE/BANDAGES/DRESSINGS) IMPLANT
DRSG OPSITE POSTOP 3X4 (GAUZE/BANDAGES/DRESSINGS) ×3 IMPLANT
DURAPREP 26ML APPLICATOR (WOUND CARE) ×3 IMPLANT
ELECT REM PT RETURN 9FT ADLT (ELECTROSURGICAL) ×3
ELECTRODE REM PT RTRN 9FT ADLT (ELECTROSURGICAL) ×1 IMPLANT
GAUZE 4X4 16PLY RFD (DISPOSABLE) ×3 IMPLANT
GLOVE BIO SURGEON STRL SZ 6.5 (GLOVE) ×6 IMPLANT
GLOVE BIO SURGEON STRL SZ7 (GLOVE) ×4 IMPLANT
GLOVE BIO SURGEON STRL SZ7.5 (GLOVE) ×4 IMPLANT
GLOVE BIO SURGEONS STRL SZ 6.5 (GLOVE) ×3
GLOVE BIOGEL PI IND STRL 6.5 (GLOVE) IMPLANT
GLOVE BIOGEL PI IND STRL 7.0 (GLOVE) IMPLANT
GLOVE BIOGEL PI IND STRL 7.5 (GLOVE) IMPLANT
GLOVE BIOGEL PI INDICATOR 6.5 (GLOVE) ×2
GLOVE BIOGEL PI INDICATOR 7.0 (GLOVE) ×4
GLOVE BIOGEL PI INDICATOR 7.5 (GLOVE) ×8
GLOVE ECLIPSE 6.5 STRL STRAW (GLOVE) ×6 IMPLANT
GLOVE INDICATOR 6.5 STRL GRN (GLOVE) ×2 IMPLANT
HEMOSTAT ARISTA ABSORB 3G PWDR (HEMOSTASIS) IMPLANT
HOLDER FOLEY CATH W/STRAP (MISCELLANEOUS) ×3 IMPLANT
KIT TURNOVER CYSTO (KITS) ×3 IMPLANT
NEEDLE INSUFFLATION 120MM (ENDOMECHANICALS) ×3 IMPLANT
NS IRRIG 500ML POUR BTL (IV SOLUTION) ×3 IMPLANT
PACK LAVH (CUSTOM PROCEDURE TRAY) ×3 IMPLANT
PACK ROBOTIC GOWN (GOWN DISPOSABLE) ×3 IMPLANT
PACK TRENDGUARD 450 HYBRID PRO (MISCELLANEOUS) IMPLANT
PAD OB MATERNITY 4.3X12.25 (PERSONAL CARE ITEMS) ×3 IMPLANT
PAD PREP 24X48 CUFFED NSTRL (MISCELLANEOUS) ×3 IMPLANT
SEALER TISSUE G2 CVD JAW 45CM (ENDOMECHANICALS) ×5 IMPLANT
SET IRRIG TUBING LAPAROSCOPIC (IRRIGATION / IRRIGATOR) ×3 IMPLANT
SET TRI-LUMEN FLTR TB AIRSEAL (TUBING) ×2 IMPLANT
SUT VIC AB 0 CT1 18XCR BRD8 (SUTURE) ×2 IMPLANT
SUT VIC AB 0 CT1 36 (SUTURE) ×10 IMPLANT
SUT VIC AB 0 CT1 8-18 (SUTURE) ×6
SUT VIC AB 3-0 PS2 18 (SUTURE) ×3
SUT VIC AB 3-0 PS2 18XBRD (SUTURE) ×1 IMPLANT
SUT VIC AB 3-0 SH 27 (SUTURE)
SUT VIC AB 3-0 SH 27X BRD (SUTURE) IMPLANT
SUT VICRYL 0 TIES 12 18 (SUTURE) ×3 IMPLANT
SUT VICRYL 0 UR6 27IN ABS (SUTURE) ×3 IMPLANT
SYR BULB IRRIGATION 50ML (SYRINGE) IMPLANT
TOWEL OR 17X26 10 PK STRL BLUE (TOWEL DISPOSABLE) ×6 IMPLANT
TRAY FOLEY W/BAG SLVR 14FR (SET/KITS/TRAYS/PACK) ×3 IMPLANT
TRENDGUARD 450 HYBRID PRO PACK (MISCELLANEOUS) ×3
TROCAR OPTI TIP 5M 100M (ENDOMECHANICALS) ×3 IMPLANT
TROCAR XCEL BLUNT TIP 100MML (ENDOMECHANICALS) IMPLANT
TROCAR XCEL DIL TIP R 11M (ENDOMECHANICALS) ×3 IMPLANT
TUBING EVAC SMOKE HEATED PNEUM (TUBING) ×3 IMPLANT
WARMER LAPAROSCOPE (MISCELLANEOUS) ×3 IMPLANT
WATER STERILE IRR 500ML POUR (IV SOLUTION) ×1 IMPLANT

## 2018-12-17 NOTE — Anesthesia Postprocedure Evaluation (Signed)
Anesthesia Post Note  Patient: Susan Morales  Procedure(s) Performed: LAPAROSCOPIC ASSISTED VAGINAL HYSTERECTOMY WITH SALPINGECTOMY (Bilateral Abdomen)     Patient location during evaluation: PACU Anesthesia Type: General Level of consciousness: awake and alert Pain management: pain level controlled Vital Signs Assessment: post-procedure vital signs reviewed and stable Respiratory status: spontaneous breathing, nonlabored ventilation, respiratory function stable and patient connected to nasal cannula oxygen Cardiovascular status: blood pressure returned to baseline and stable Postop Assessment: no apparent nausea or vomiting Anesthetic complications: no    Last Vitals:  Vitals:   12/17/18 1000 12/17/18 1004  BP: 125/70   Pulse: 90 96  Resp: 14 15  Temp:    SpO2: 91% 94%    Last Pain:  Vitals:   12/17/18 1000  TempSrc:   PainSc: 3                  Toyia Jelinek COKER

## 2018-12-17 NOTE — Anesthesia Preprocedure Evaluation (Signed)
Anesthesia Evaluation  Patient identified by MRN, date of birth, ID band Patient awake    Reviewed: Allergy & Precautions, NPO status , Patient's Chart, lab work & pertinent test results  Airway Mallampati: III  TM Distance: >3 FB Neck ROM: Full    Dental  (+) Teeth Intact, Dental Advisory Given   Pulmonary    breath sounds clear to auscultation       Cardiovascular  Rhythm:Regular Rate:Normal     Neuro/Psych    GI/Hepatic   Endo/Other    Renal/GU      Musculoskeletal   Abdominal (+) + obese,   Peds  Hematology   Anesthesia Other Findings   Reproductive/Obstetrics                             Anesthesia Physical Anesthesia Plan  ASA: II  Anesthesia Plan: General   Post-op Pain Management:    Induction: Intravenous  PONV Risk Score and Plan: Ondansetron and Dexamethasone  Airway Management Planned: Oral ETT  Additional Equipment:   Intra-op Plan:   Post-operative Plan: Extubation in OR  Informed Consent: I have reviewed the patients History and Physical, chart, labs and discussed the procedure including the risks, benefits and alternatives for the proposed anesthesia with the patient or authorized representative who has indicated his/her understanding and acceptance.     Dental advisory given  Plan Discussed with: CRNA and Anesthesiologist  Anesthesia Plan Comments:         Anesthesia Quick Evaluation

## 2018-12-17 NOTE — Transfer of Care (Signed)
Immediate Anesthesia Transfer of Care Note  Patient: Susan Morales  Procedure(s) Performed: LAPAROSCOPIC ASSISTED VAGINAL HYSTERECTOMY WITH SALPINGECTOMY (Bilateral Abdomen)  Patient Location: PACU  Anesthesia Type:General  Level of Consciousness: drowsy  Airway & Oxygen Therapy: Patient Spontanous Breathing and Patient connected to nasal cannula oxygen  Post-op Assessment: Report given to RN  Post vital signs: Reviewed and stable  Last Vitals:  Vitals Value Taken Time  BP 132/73 12/17/18 0926  Temp    Pulse 88 12/17/18 0928  Resp 22 12/17/18 0928  SpO2 98 % 12/17/18 0928  Vitals shown include unvalidated device data.  Last Pain:  Vitals:   12/17/18 0557  TempSrc: Oral  PainSc: 0-No pain      Patients Stated Pain Goal: 5 (76/39/43 2003)  Complications: No apparent anesthesia complications

## 2018-12-17 NOTE — H&P (Signed)
48 year old female with dysmenorrhea and pelvic pain.  History of Endometrial ablation.  Past Medical History:  Diagnosis Date  . Complication of anesthesia    Deviate septum, combative  . Depression   . GERD (gastroesophageal reflux disease)   . Hemorrhoid   . UTI (urinary tract infection)    Past Surgical History:  Procedure Laterality Date  . ABLATION  2015  . DIRECT LARYNGOSCOPY N/A 01/29/2013   Procedure: DIRECT LARYNGOSCOPY, esophagoscopy, bronchoscopy, ;  Surgeon: Ruby Cola, MD;  Location: [facility];  Service: ENT;  Laterality: N/A;   Patient has no known allergies.  Prior to Admission medications   Medication Sig Start Date End Date Taking? Authorizing Provider  cetirizine (ZYRTEC) 10 MG tablet Take 10 mg by mouth daily.   Yes [provider]  FLUoxetine (PROZAC) 20 MG tablet Take 2 tablets (40 mg total) by mouth daily. For anxiety. 09/14/18  Yes Pleas Koch, NP  ibuprofen (ADVIL,MOTRIN) 200 MG tablet Take 400 mg by mouth every 6 (six) hours as needed for moderate pain.    Yes [provider]  omeprazole (PRILOSEC) 20 MG capsule Take 40 mg by mouth at bedtime.   Yes [provider]  OVER THE COUNTER MEDICATION Take 1 tablet by mouth daily. Fibro-Response   Yes [provider]  vitamin B-12 (CYANOCOBALAMIN) 500 MCG tablet Take 500 mcg by mouth daily.   Yes [provider]  hydrOXYzine (ATARAX/VISTARIL) 10 MG tablet Take 1-2 tablets (10-20 mg total) by mouth 2 (two) times daily as needed for anxiety. Patient not taking: Reported on 12/10/2018 09/14/18   Pleas Koch, NP  omeprazole (PRILOSEC) 40 MG capsule Take 1 capsule (40 mg total) by mouth daily. Patient not taking: Reported on 12/10/2018 03/01/18   Pleas Koch, NP   Family History  Adopted: Yes  Problem Relation Age of Onset  . Other Other        Adopted   Social History   Socioeconomic History  . Marital status: Married    Spouse name: Not on file  .  Number of children: Not on file  . Years of education: Not on file  . Highest education level: Not on file  Occupational History  . Not on file  Social Needs  . Financial resource strain: Not on file  . Food insecurity    Worry: Not on file    Inability: Not on file  . Transportation needs    Medical: Not on file    Non-medical: Not on file  Tobacco Use  . Smoking status: Never Smoker  . Smokeless tobacco: Never Used  Substance and Sexual Activity  . Alcohol use: No    Alcohol/week: 0.0 standard drinks  . Drug use: No  . Sexual activity: Not on file  Lifestyle  . Physical activity    Days per week: Not on file    Minutes per session: Not on file  . Stress: Not on file  Relationships  . Social Herbalist on phone: Not on file    Gets together: Not on file    Attends religious service: Not on file    Active member of club or organization: Not on file    Attends meetings of clubs or organizations: Not on file    Relationship status: Not on file  Other Topics Concern  . Not on file  Social History Narrative   Married.   4 children.   Works as a Print production planner.  Enjoys reading, spending time with her family.   BP (!) 176/95   Pulse 97   Temp 98.6 F (37 C) (Oral)   Resp 18   Ht 5\' 4"  (1.626 m)   Wt 35 kg   LMP 08/11/2018   SpO2 99%   BMI 13.25 kg/m  General alert and oriented LunG CTAB Car RRR Abdomen soft and non tender  Pelvic good descensus Uterus ?adenomyosis  IMPRESSION: Dysmenorrhea Possible adenomyosis  PLAN: LAVH, BS Risks reviewed and patient consented

## 2018-12-17 NOTE — Brief Op Note (Signed)
12/17/2018  9:18 AM  PATIENT:  Susan Morales  48 y.o. female  PRE-OPERATIVE DIAGNOSIS:   Fibroid Pelvic Pain Possible adenomyosis  POST-OPERATIVE DIAGNOSIS: Same  PROCEDURE:  Procedure(s): LAPAROSCOPIC ASSISTED VAGINAL HYSTERECTOMY WITH SALPINGECTOMY (Bilateral)  SURGEON:  Surgeon(s) and Role:    * Dian Queen, MD - Primary    * Royston Sinner Colin Benton, MD - Assisting  PHYSICIAN ASSISTANT:   ASSISTANTS: none   ANESTHESIA:   general  EBL:  150 mL   BLOOD ADMINISTERED:none  DRAINS: Urinary Catheter (Foley)   LOCAL MEDICATIONS USED:  NONE  SPECIMEN:  Source of Specimen:  uterus cervix and tubes  DISPOSITION OF SPECIMEN:  PATHOLOGY  COUNTS:  YES  TOURNIQUET:  * No tourniquets in log *  DICTATION: .Other Dictation: Dictation Number dictated  PLAN OF CARE: Admit for overnight observation  PATIENT DISPOSITION:  PACU - hemodynamically stable.   Delay start of Pharmacological VTE agent (>24hrs) due to surgical blood loss or risk of bleeding: not applicable

## 2018-12-17 NOTE — Anesthesia Procedure Notes (Signed)
Procedure Name: Intubation Date/Time: 12/17/2018 7:32 AM Performed by: Bonney Aid, CRNA Pre-anesthesia Checklist: Patient identified, Emergency Drugs available, Suction available and Patient being monitored Patient Re-evaluated:Patient Re-evaluated prior to induction Oxygen Delivery Method: Circle system utilized Preoxygenation: Pre-oxygenation with 100% oxygen Induction Type: IV induction Ventilation: Mask ventilation without difficulty Laryngoscope Size: Mac and 3 Grade View: Grade II Tube type: Oral Tube size: 7.0 mm Number of attempts: 1 Airway Equipment and Method: Stylet and Oral airway Placement Confirmation: ETT inserted through vocal cords under direct vision,  positive ETCO2 and breath sounds checked- equal and bilateral Tube secured with: Tape Dental Injury: Teeth and Oropharynx as per pre-operative assessment

## 2018-12-18 DIAGNOSIS — N946 Dysmenorrhea, unspecified: Secondary | ICD-10-CM | POA: Diagnosis not present

## 2018-12-18 LAB — CBC
HCT: 36.2 % (ref 36.0–46.0)
Hemoglobin: 11.6 g/dL — ABNORMAL LOW (ref 12.0–15.0)
MCH: 28.7 pg (ref 26.0–34.0)
MCHC: 32 g/dL (ref 30.0–36.0)
MCV: 89.6 fL (ref 80.0–100.0)
Platelets: 253 10*3/uL (ref 150–400)
RBC: 4.04 MIL/uL (ref 3.87–5.11)
RDW: 12.8 % (ref 11.5–15.5)
WBC: 14.2 10*3/uL — ABNORMAL HIGH (ref 4.0–10.5)
nRBC: 0 % (ref 0.0–0.2)

## 2018-12-18 MED ORDER — OXYCODONE HCL 5 MG PO TABS
ORAL_TABLET | ORAL | Status: AC
  Filled 2018-12-18: qty 1

## 2018-12-18 MED ORDER — OXYCODONE HCL 5 MG PO TABS: 5.0000 mg | ORAL_TABLET | ORAL | 0 refills | Status: DC | PRN

## 2018-12-18 NOTE — Discharge Summary (Signed)
Admission Diagnosis: Pelvic Pain Fibroid  Discharge Diagnosis: Same  Hospital Course: 48 year old female here for LAVH, BS. Did very well postop.  By POD # 1 was voiding fine, ambulating tolerating regular diet.  BP 133/69 (BP Location: Left Arm)   Pulse 73   Temp 97.6 F (36.4 C)   Resp 16   Ht 5\' 4"  (1.626 m)   LMP 08/11/2018   SpO2 98%   BMI 47.20 kg/m  Abdomen is soft and non tender Incisions clean and dry and intact  Results for orders placed or performed during the hospital encounter of 12/17/18 (from the past 24 hour(s))  CBC     Status: Abnormal   Collection Time: 12/18/18  5:19 AM  Result Value Ref Range   WBC 14.2 (H) 4.0 - 10.5 K/uL   RBC 4.04 3.87 - 5.11 MIL/uL   Hemoglobin 11.6 (L) 12.0 - 15.0 g/dL   HCT 36.2 36.0 - 46.0 %   MCV 89.6 80.0 - 100.0 fL   MCH 28.7 26.0 - 34.0 pg   MCHC 32.0 30.0 - 36.0 g/dL   RDW 12.8 11.5 - 15.5 %   Platelets 253 150 - 400 K/uL   nRBC 0.0 0.0 - 0.2 %   Discharged home in good condition Rx Ibuprofen and Oxycodone  Follow  Up in 1 to 2 weeks Discharge precautions given to patient

## 2018-12-19 ENCOUNTER — Encounter (HOSPITAL_BASED_OUTPATIENT_CLINIC_OR_DEPARTMENT_OTHER): Payer: Self-pay | Admitting: Obstetrics and Gynecology

## 2018-12-20 ENCOUNTER — Ambulatory Visit: Payer: 59 | Admitting: Psychology

## 2018-12-20 NOTE — Op Note (Signed)
NAME: Susan Morales, RISS MEDICAL RECORD [id number] ACCOUNT [id number] DATE OF BIRTH:Oct 30, 1970 FACILITY: [facility] LOCATION: [facility] PHYSICIAN:Bobie Kistler Lynett Fish, MD  OPERATIVE REPORT  DATE OF PROCEDURE:  12/17/2018  PREOPERATIVE DIAGNOSES:  Pelvic pain, fibroids, probable adenomyosis.  POSTOPERATIVE DIAGNOSES:  Pelvic pain, fibroids, probable adenomyosis.  PROCEDURE:  Laparoscopic-assisted vaginal hysterectomy and bilateral salpingectomy.  SURGEON:  Dian Queen, MD  ASSISTANT:  Lucillie Garfinkel, MD  ANESTHESIA:  General.  ESTIMATED BLOOD LOSS:  150 mL.  COMPLICATIONS:  None.  DRAINS:  Foley catheter.  DESCRIPTION OF PROCEDURE:  The patient was taken to the operating room.  She was then prepped and draped in the usual sterile fashion.  A uterine manipulator and Foley catheter were inserted.  Attention was turned to the abdomen where a small incision  was made at the umbilicus, and a Veress needle was inserted one time on CO2 with ____ pneumoperitoneum.  The Veress was removed, and an 11 mm trocar was inserted.  The patient was gently placed in Trendelenburg position.  The scope was introduced through  the sheath.  No area of bleeding or intestinal injury was noted.  A secondary site was placed suprapubically with a 5 mm port.  Attention was turned to the pelvis.  The ovaries were unremarkable.  The uterus ____ consistent ____ with adenomyosis, and  there was a fibroid that was subserosal.  The EnSeal was inserted, the right fallopian tube was grasped, and the mesosalpinx was cauterized and transected all the way down to the round ligament on the right side and on the left in similar fashion.   Hemostasis was excellent.  We went down to the pelvis.  A speculum was inserted into the vagina, and the cervix was grasped with a tenaculum.  A circumferential incision was made around the cervix.  The posterior and then the anterior cul-de-sacs were  entered with Mayo scissors, and the  uterosacral cardinal ligaments were clamped on either side.  Each pedicle was clamped, cut and suture ligated using 0 Vicryl suture.  We then walked our way up carefully to the broad ligament.  Each pedicle was  clamped, cut and suture ligated using 0 Vicryl suture.  We then removed the specimen, identified as "cervix, uterus, and fallopian tube."  It was sent to pathology.  We then placed our angled stitches at 3 and 9 o'clock with 0 Vicryl suture, closed the  posterior cuff in a running locked fashion using 0 Vicryl suture, closed the cuff completely using 0 Vicryl suture.  We then went back up to the abdomen, replaced the pneumoperitoneum, irrigated the pelvis.  The pelvis was completely hemostatic.  We then  released the pneumoperitoneum, removed the trocars.  The infraumbilical site was closed with 0 Vicryl deep stitch.  The skin was closed at both sites with Dermabond.  All sponge, lap and instrument counts were correct x2.  The patient was extubated and  went to recovery room in stable condition.  LN/NUANCE  D:12/20/2018 T:12/20/2018 JOB:[id number]

## 2018-12-30 ENCOUNTER — Emergency Department (HOSPITAL_COMMUNITY)
Admission: EM | Admit: 2018-12-30 | Discharge: 2018-12-30 | Disposition: A | Discharge status: Home / Self Care | Payer: Managed Care, Other (non HMO) | Attending: Emergency Medicine | Admitting: Emergency Medicine

## 2018-12-30 ENCOUNTER — Other Ambulatory Visit: Payer: Self-pay

## 2018-12-30 ENCOUNTER — Emergency Department (HOSPITAL_COMMUNITY): Payer: Managed Care, Other (non HMO)

## 2018-12-30 ENCOUNTER — Encounter (HOSPITAL_COMMUNITY): Payer: Self-pay | Admitting: Emergency Medicine

## 2018-12-30 DIAGNOSIS — N732 Unspecified parametritis and pelvic cellulitis: Secondary | ICD-10-CM | POA: Diagnosis not present

## 2018-12-30 DIAGNOSIS — N939 Abnormal uterine and vaginal bleeding, unspecified: Secondary | ICD-10-CM

## 2018-12-30 DIAGNOSIS — N9989 Other postprocedural complications and disorders of genitourinary system: Secondary | ICD-10-CM | POA: Insufficient documentation

## 2018-12-30 DIAGNOSIS — N760 Acute vaginitis: Secondary | ICD-10-CM

## 2018-12-30 LAB — URINALYSIS, ROUTINE W REFLEX MICROSCOPIC
Bacteria, UA: NONE SEEN
Bilirubin Urine: NEGATIVE
Glucose, UA: NEGATIVE mg/dL
Ketones, ur: NEGATIVE mg/dL
Nitrite: NEGATIVE
Protein, ur: NEGATIVE mg/dL
RBC / HPF: >50 RBC/hpf — ABNORMAL HIGH (ref 0–5)
Specific Gravity, Urine: 1.01 (ref 1.005–1.030)
pH: 7 (ref 5.0–8.0)

## 2018-12-30 LAB — BASIC METABOLIC PANEL
Anion gap: 11 (ref 5–15)
BUN: 10 mg/dL (ref 6–20)
CO2: 25 mmol/L (ref 22–32)
Calcium: 9.2 mg/dL (ref 8.9–10.3)
Chloride: 102 mmol/L (ref 98–111)
Creatinine, Ser: 0.67 mg/dL (ref 0.44–1.00)
GFR calc Af Amer: >60 mL/min (ref >60)
GFR calc non Af Amer: >60 mL/min (ref >60)
Glucose, Bld: 116 mg/dL — ABNORMAL HIGH (ref 70–99)
Potassium: 3.8 mmol/L (ref 3.5–5.1)
Sodium: 138 mmol/L (ref 135–145)

## 2018-12-30 LAB — CBC WITH DIFFERENTIAL/PLATELET
Abs Immature Granulocytes: 0.04 10*3/uL (ref 0.00–0.07)
Basophils Absolute: 0 10*3/uL (ref 0.0–0.1)
Basophils Relative: 0 %
Eosinophils Absolute: 0.2 10*3/uL (ref 0.0–0.5)
Eosinophils Relative: 2 %
HCT: 38.8 % (ref 36.0–46.0)
Hemoglobin: 12.8 g/dL (ref 12.0–15.0)
Immature Granulocytes: 0 %
Lymphocytes Relative: 21 %
Lymphs Abs: 2 10*3/uL (ref 0.7–4.0)
MCH: 28.4 pg (ref 26.0–34.0)
MCHC: 33 g/dL (ref 30.0–36.0)
MCV: 86.2 fL (ref 80.0–100.0)
Monocytes Absolute: 0.4 10*3/uL (ref 0.1–1.0)
Monocytes Relative: 4 %
Neutro Abs: 7 10*3/uL (ref 1.7–7.7)
Neutrophils Relative %: 73 %
Platelets: 263 10*3/uL (ref 150–400)
RBC: 4.5 MIL/uL (ref 3.87–5.11)
RDW: 12.7 % (ref 11.5–15.5)
WBC: 9.7 10*3/uL (ref 4.0–10.5)
nRBC: 0 % (ref 0.0–0.2)

## 2018-12-30 MED ORDER — AMOXICILLIN-POT CLAVULANATE 875-125 MG PO TABS: 1.0000 | ORAL_TABLET | Freq: Two times a day (BID) | ORAL | 0 refills | Status: AC

## 2018-12-30 NOTE — ED Triage Notes (Signed)
Pt. Stated, I had a vaginal hysterectomy on June 22. I saw the Dr. On Tuesday and had infection I stopped bleeding and start back bleeding on /Tuesday Runell Gess was the surgeon. Told me to come here.

## 2018-12-30 NOTE — ED Provider Notes (Signed)
[facility] EMERGENCY DEPARTMENT Provider Note   CSN: [id number] Arrival date & time: 12/30/18  1430     History   Chief Complaint Chief Complaint  Patient presents with  . Post-op Problem  . Vaginal Bleeding    HPI Susan Morales is a 48 y.o. female.     HPI  Patient is a 48 year old female with a past medical history of depression, UTI, GERD presenting for vaginal bleeding postoperatively after total vaginal hysterectomy on 12-17-2018 per Dr. Helane Rima.  Patient reports that after the surgery she had some light passage of clots and some irritation.  Her surgeon saw her in follow-up and noted that she had a vaginal cuff infection.  She was placed on Augmentin and Diflucan.  Patient reports that she was subsequently doing well, and the bleeding was tapering off however today couple hours prior to arrival in the emergency department she had sudden gushing of blood with soakage of approximately 1 pad per hour for 2 hours.  It is since tapered off slightly and she is now passing mid sized clots.  She reports some mild abdominal cramping but otherwise does not have much pain.  She denies fevers, chills, myalgias or rigors, or nausea or vomiting.  Past Medical History:  Diagnosis Date  . Complication of anesthesia    Deviate septum, combative  . Depression   . GERD (gastroesophageal reflux disease)   . Hemorrhoid   . UTI (urinary tract infection)     Patient Active Problem List   Diagnosis Date Noted  . Pelvic pain 12/17/2018  . Adenomyosis 12/17/2018  . Chronic cough 03/01/2018  . Prediabetes 05/08/2017  . Vertigo 02/15/2017  . Fatigue 10/26/2016  . Fibromyalgia 10/26/2016  . Anxiety and depression 06/04/2015  . GERD (gastroesophageal reflux disease) 06/04/2015    Past Surgical History:  Procedure Laterality Date  . ABLATION  2015  . DIRECT LARYNGOSCOPY N/A 01/29/2013   Procedure: DIRECT LARYNGOSCOPY, esophagoscopy, bronchoscopy, ;  Surgeon: Ruby Cola,  MD;  Location: [facility];  Service: ENT;  Laterality: N/A;  . LAPAROSCOPIC VAGINAL HYSTERECTOMY WITH SALPINGECTOMY Bilateral 12/17/2018   Procedure: LAPAROSCOPIC ASSISTED VAGINAL HYSTERECTOMY WITH SALPINGECTOMY;  Surgeon: Dian Queen, MD;  Location: [facility];  Service: Gynecology;  Laterality: Bilateral;     OB History   No obstetric history on file.      Home Medications    Prior to Admission medications   Medication Sig Start Date End Date Taking? Authorizing Provider  cetirizine (ZYRTEC) 10 MG tablet Take 10 mg by mouth daily.    [provider]  FLUoxetine (PROZAC) 20 MG tablet Take 2 tablets (40 mg total) by mouth daily. For anxiety. 09/14/18   Pleas Koch, NP  hydrOXYzine (ATARAX/VISTARIL) 10 MG tablet Take 1-2 tablets (10-20 mg total) by mouth 2 (two) times daily as needed for anxiety. Patient not taking: Reported on 12/10/2018 09/14/18   Pleas Koch, NP  ibuprofen (ADVIL,MOTRIN) 200 MG tablet Take 400 mg by mouth every 6 (six) hours as needed for moderate pain.     [provider]  omeprazole (PRILOSEC) 20 MG capsule Take 40 mg by mouth at bedtime.    [provider]  omeprazole (PRILOSEC) 40 MG capsule Take 1 capsule (40 mg total) by mouth daily. Patient not taking: Reported on 12/10/2018 03/01/18   Pleas Koch, NP  OVER THE COUNTER MEDICATION Take 1 tablet by mouth daily. Fibro-Response    [provider]  oxyCODONE (OXY IR/ROXICODONE) 5  MG immediate release tablet Take 1-2 tablets (5-10 mg total) by mouth every 4 (four) hours as needed for moderate pain. 12/18/18   Dian Queen, MD  vitamin B-12 (CYANOCOBALAMIN) 500 MCG tablet Take 500 mcg by mouth daily.    [provider]    Family History Family History  Adopted: Yes  Problem Relation Age of Onset  . Other Other        Adopted    Social History Social History   Tobacco Use  . Smoking status: Never Smoker  . Smokeless tobacco: Never  Used  Substance Use Topics  . Alcohol use: No    Alcohol/week: 0.0 standard drinks  . Drug use: No     Allergies   Patient has no known allergies.   Review of Systems Review of Systems  Constitutional: Negative for chills and fever.  HENT: Negative for congestion, rhinorrhea, sinus pain and sore throat.   Eyes: Negative for visual disturbance.  Respiratory: Negative for cough, chest tightness and shortness of breath.   Cardiovascular: Negative for chest pain, palpitations and leg swelling.  Gastrointestinal: Negative for abdominal pain, constipation, diarrhea, nausea and vomiting.  Genitourinary: Positive for vaginal bleeding. Negative for dysuria, flank pain and vaginal discharge.  Musculoskeletal: Negative for back pain and myalgias.  Skin: Negative for rash.  Neurological: Negative for dizziness, syncope, light-headedness and headaches.     Physical Exam Updated Vital Signs BP 135/71   Pulse 90   Temp 99.3 F (37.4 C) (Oral)   Resp (!) 21   Ht 5\' 4"  (1.626 m)   Wt 122.5 kg   SpO2 97%   BMI 46.35 kg/m   Physical Exam Vitals signs and nursing note reviewed.  Constitutional:      General: She is not in acute distress.    Appearance: She is well-developed.  HENT:     Head: Normocephalic and atraumatic.  Eyes:     Conjunctiva/sclera: Conjunctivae normal.     Pupils: Pupils are equal, round, and reactive to light.  Neck:     Musculoskeletal: Normal range of motion and neck supple.  Cardiovascular:     Rate and Rhythm: Normal rate and regular rhythm.     Heart sounds: S1 normal and S2 normal. No murmur.  Pulmonary:     Effort: Pulmonary effort is normal.     Breath sounds: Normal breath sounds. No wheezing or rales.  Abdominal:     General: There is no distension.     Palpations: Abdomen is soft.     Tenderness: There is no abdominal tenderness. There is no guarding.     Comments: No lower abdominal discomfort.  Genitourinary:    Comments: Genitourinary  exam performed with RN chaperone present.  Patient has mild swelling of vaginal walls.  There is a singular dark red clot of blood in the introitus.  No vaginal discharge. Musculoskeletal: Normal range of motion.        General: No deformity.  Lymphadenopathy:     Cervical: No cervical adenopathy.  Skin:    General: Skin is warm and dry.     Findings: No erythema or rash.  Neurological:     Mental Status: She is alert.     Comments: Cranial nerves grossly intact. Patient moves extremities symmetrically and with good coordination.  Psychiatric:        Behavior: Behavior normal.        Thought Content: Thought content normal.        Judgment: Judgment normal.  ED Treatments / Results  Labs (all labs ordered are listed, but only abnormal results are displayed) Labs Reviewed  URINALYSIS, ROUTINE W REFLEX MICROSCOPIC - Abnormal; Notable for the following components:      Result Value   Hgb urine dipstick LARGE (*)    Leukocytes,Ua TRACE (*)    RBC / HPF >50 (*)    All other components within normal limits  BASIC METABOLIC PANEL - Abnormal; Notable for the following components:   Glucose, Bld 116 (*)    All other components within normal limits  CBC WITH DIFFERENTIAL/PLATELET    EKG None  Radiology US Pelvis Transvaginal Non-ob (tv Only)  Result Date: 12/30/2018 CLINICAL DATA:  Recent hysterectomy. Patient complains bleeding and clotting. EXAM: ULTRASOUND PELVIS TRANSVAGINAL TECHNIQUE: Transvaginal ultrasound examination of the pelvis was performed including evaluation of the uterus, ovaries, adnexal regions, and pelvic cul-de-sac. COMPARISON:  None. FINDINGS: Uterus Surgically absent. Endometrium Surgically absent. Right ovary Measurements: 1.7 x 1 x 1 cm = volume: 0.93 mL. Normal appearance/no adnexal mass. Left ovary Measurements: 1.9 x 0.9 x 1.4 cm = volume: 1.25 mL. Normal appearance. Small complex cyst in the left ovary identified measuring 1.1 x 0.8 x 0.7 cm. Other  findings:  Trace free fluid is identified. IMPRESSION: Surgically absent uterus. The ovaries are unremarkable. Trace free fluid in the pelvis. Electronically Signed   By: Abelardo Diesel M.D.   On: 12/30/2018 18:06    Procedures Procedures (including critical care time)  Medications Ordered in ED Medications - No data to display   Initial Impression / Assessment and Plan / ED Course  I have reviewed the triage vital signs and the nursing notes.  Pertinent labs & imaging results that were available during my care of the patient were reviewed by me and considered in my medical decision making (see chart for details).  Clinical Course as of Dec 30 1914  Sun Dec 30, 2018  1712 Spoke with Dr. Helane Rima of OBGYN who recommends TVUS to assess for vaginal cuff hematoma. Will call back with results. She also recommends extending the amoxicillin for an additional week.    [AM]  3244 No evident measurable hematoma. Will consult Dr. Helane Rima.   US PELVIS TRANSVAGINAL NON-OB (TV ONLY) [AM]    Clinical Course User Index [AM] Albesa Seen, PA-C       This is a well-appearing 48 year old female status post total vaginal hysterectomy on 12-17-2018 presenting for vaginal bleeding.  Case was discussed with Dr. Helane Rima of OB/GYN who feels that the bleeding likely caused by vaginal cuff hematoma secondary to vaginal cuff cellulitis she was diagnosed with earlier in the week.  Recommends obtaining transvaginal ultrasound, lab work, and will call back for reassessment.  Patient's hemoglobin is 12.8.  There is no leukocytosis.  Urinalysis with large hemoglobin and trace leukocytes.  Hemoglobinuria consistent with vaginal bleeding.  Not indicative of infection.  BMP unremarkable.  Transvaginal ultrasound demonstrates no evidence of remaining hematoma.  She does have a complex left ovarian cyst.  Dr. Helane Rima was informed of these results.  She recommended extending antibiotic course an additional week.  Patient can  return precautions for any sudden worsening of bleeding, or worsening pain.  She will follow up with OBGYN this week. Patient is in understanding and agrees with the plan of care.  Final Clinical Impressions(s) / ED Diagnoses   Final diagnoses:  Vaginal cuff cellulitis  Vaginal bleeding  Postoperative surgical complication involving genitourinary system associated with genitourinary procedure, unspecified complication  ED Discharge Orders         Ordered    amoxicillin-clavulanate (AUGMENTIN) 875-125 MG tablet  Every 12 hours     12/30/18 1946           Tamala Julian 12/30/18 1946    Quintella Reichert, MD 12/30/18 [phone]

## 2018-12-30 NOTE — Discharge Instructions (Signed)
Please see the information and instructions below regarding your visit.  Your diagnoses today include:  1. Vaginal bleeding   2. Vaginal cuff cellulitis   3. Postoperative surgical complication involving genitourinary system associated with genitourinary procedure, unspecified complication     Tests performed today include: See side panel of your discharge paperwork for testing performed today. Vital signs are listed at the bottom of these instructions.   Your ultrasound shows no obvious hematoma left on imaging.   Medications prescribed:    Take any prescribed medications only as prescribed, and any over the counter medications only as directed on the packaging.  Please resume your antibiotic twice a day for a week.  This can cause diarrhea, so I recommend probiotic or yogurt to protect her stomach.  Home care instructions:  Please follow any educational materials contained in this packet.   Follow-up instructions: Please follow-up with Dr. Helane Rima by calling the office tomorrow.   Return instructions:  Please return to the Emergency Department if you experience worsening symptoms.  Please come back to the emergency department if you have sudden gushing of blood per the vagina and increased rapidity of bleeding, feel weak, dizzy or lightheaded. Please return if you have any other emergent concerns.  Additional Information:   Your vital signs today were: BP 135/71    Pulse 90    Temp 99.3 F (37.4 C) (Oral)    Resp (!) 21    Ht 5\' 4"  (1.626 m)    Wt 122.5 kg    SpO2 97%    BMI 46.35 kg/m  If your blood pressure (BP) was elevated on multiple readings during this visit above 130 for the top number or above 80 for the bottom number, please have this repeated by your primary care provider within one month. --------------  Thank you for allowing [place] to participate in your care today.

## 2019-01-03 ENCOUNTER — Ambulatory Visit: Payer: 59 | Admitting: Psychology

## 2019-01-14 ENCOUNTER — Other Ambulatory Visit: Payer: Self-pay | Admitting: Obstetrics and Gynecology

## 2019-01-14 DIAGNOSIS — Z1231 Encounter for screening mammogram for malignant neoplasm of breast: Secondary | ICD-10-CM

## 2019-01-17 ENCOUNTER — Ambulatory Visit: Payer: 59 | Admitting: Psychology

## 2019-02-27 ENCOUNTER — Ambulatory Visit
Admission: RE | Admit: 2019-02-27 | Discharge: 2019-02-27 | Disposition: A | Discharge status: Home / Self Care | Payer: Managed Care, Other (non HMO) | Source: Ambulatory Visit | Attending: Obstetrics and Gynecology | Admitting: Obstetrics and Gynecology

## 2019-02-27 ENCOUNTER — Other Ambulatory Visit: Payer: Self-pay

## 2019-02-27 DIAGNOSIS — Z1231 Encounter for screening mammogram for malignant neoplasm of breast: Secondary | ICD-10-CM

## 2019-04-22 ENCOUNTER — Other Ambulatory Visit: Payer: Self-pay | Admitting: Primary Care

## 2019-04-22 DIAGNOSIS — F419 Anxiety disorder, unspecified: Secondary | ICD-10-CM

## 2019-04-22 DIAGNOSIS — F329 Major depressive disorder, single episode, unspecified: Secondary | ICD-10-CM

## 2019-05-12 ENCOUNTER — Other Ambulatory Visit: Payer: Self-pay | Admitting: Primary Care

## 2019-05-12 DIAGNOSIS — F329 Major depressive disorder, single episode, unspecified: Secondary | ICD-10-CM

## 2019-05-12 DIAGNOSIS — F419 Anxiety disorder, unspecified: Secondary | ICD-10-CM

## 2019-05-13 NOTE — Telephone Encounter (Signed)
Please call patient and scheduled annual exam. Send back to [place] for refill after the appointment is scheduled.

## 2019-05-15 NOTE — Telephone Encounter (Signed)
Lvm asking patient to call office. °

## 2019-05-15 NOTE — Telephone Encounter (Signed)
Patient scheduled cpx on 06/13/19.

## 2019-05-31 ENCOUNTER — Other Ambulatory Visit: Payer: Self-pay | Admitting: Primary Care

## 2019-05-31 DIAGNOSIS — R7303 Prediabetes: Secondary | ICD-10-CM

## 2019-06-06 ENCOUNTER — Other Ambulatory Visit: Payer: Managed Care, Other (non HMO)

## 2019-06-13 ENCOUNTER — Encounter: Payer: Managed Care, Other (non HMO) | Admitting: Primary Care

## 2019-07-18 ENCOUNTER — Other Ambulatory Visit: Payer: Managed Care, Other (non HMO)

## 2019-07-25 ENCOUNTER — Encounter: Payer: Managed Care, Other (non HMO) | Admitting: Primary Care

## 2019-08-14 ENCOUNTER — Other Ambulatory Visit: Payer: Self-pay | Admitting: Primary Care

## 2019-08-14 ENCOUNTER — Other Ambulatory Visit: Payer: Self-pay

## 2019-08-14 ENCOUNTER — Other Ambulatory Visit (INDEPENDENT_AMBULATORY_CARE_PROVIDER_SITE_OTHER): Payer: Managed Care, Other (non HMO)

## 2019-08-14 DIAGNOSIS — F329 Major depressive disorder, single episode, unspecified: Secondary | ICD-10-CM

## 2019-08-14 DIAGNOSIS — R7303 Prediabetes: Secondary | ICD-10-CM | POA: Diagnosis not present

## 2019-08-14 DIAGNOSIS — F419 Anxiety disorder, unspecified: Secondary | ICD-10-CM

## 2019-08-14 LAB — COMPREHENSIVE METABOLIC PANEL
ALT: 22 U/L (ref 0–35)
AST: 15 U/L (ref 0–37)
Albumin: 3.9 g/dL (ref 3.5–5.2)
Alkaline Phosphatase: 67 U/L (ref 39–117)
BUN: 14 mg/dL (ref 6–23)
CO2: 30 mEq/L (ref 19–32)
Calcium: 9.4 mg/dL (ref 8.4–10.5)
Chloride: 102 mEq/L (ref 96–112)
Creatinine, Ser: 0.66 mg/dL (ref 0.40–1.20)
GFR: 94.85 mL/min (ref >60.00)
Glucose, Bld: 107 mg/dL — ABNORMAL HIGH (ref 70–99)
Potassium: 3.9 mEq/L (ref 3.5–5.1)
Sodium: 139 mEq/L (ref 135–145)
Total Bilirubin: 0.7 mg/dL (ref 0.2–1.2)
Total Protein: 7.3 g/dL (ref 6.0–8.3)

## 2019-08-14 LAB — LIPID PANEL
Cholesterol: 209 mg/dL — ABNORMAL HIGH (ref 0–200)
HDL: 50.4 mg/dL (ref >39.00)
LDL Cholesterol: 134 mg/dL — ABNORMAL HIGH (ref 0–99)
NonHDL: 158.96
Total CHOL/HDL Ratio: 4
Triglycerides: 124 mg/dL (ref 0.0–149.0)
VLDL: 24.8 mg/dL (ref 0.0–40.0)

## 2019-08-14 LAB — CBC
HCT: 38 % (ref 36.0–46.0)
Hemoglobin: 12.8 g/dL (ref 12.0–15.0)
MCHC: 33.6 g/dL (ref 30.0–36.0)
MCV: 85 fl (ref 78.0–100.0)
Platelets: 250 10*3/uL (ref 150.0–400.0)
RBC: 4.46 Mil/uL (ref 3.87–5.11)
RDW: 13.6 % (ref 11.5–15.5)
WBC: 6.3 10*3/uL (ref 4.0–10.5)

## 2019-08-14 LAB — HEMOGLOBIN A1C: Hgb A1c MFr Bld: 6.6 % — ABNORMAL HIGH (ref 4.6–6.5)

## 2019-08-21 ENCOUNTER — Encounter: Payer: Managed Care, Other (non HMO) | Admitting: Primary Care

## 2019-08-22 ENCOUNTER — Other Ambulatory Visit: Payer: Self-pay

## 2019-08-22 ENCOUNTER — Encounter: Payer: Self-pay | Admitting: Primary Care

## 2019-08-22 ENCOUNTER — Ambulatory Visit (INDEPENDENT_AMBULATORY_CARE_PROVIDER_SITE_OTHER): Payer: Managed Care, Other (non HMO) | Admitting: Primary Care

## 2019-08-22 VITALS — BP 124/80 | HR 84 | Temp 97.3°F | Ht 64.0 in | Wt 280.2 lb

## 2019-08-22 DIAGNOSIS — Z0000 Encounter for general adult medical examination without abnormal findings: Secondary | ICD-10-CM | POA: Diagnosis not present

## 2019-08-22 DIAGNOSIS — E119 Type 2 diabetes mellitus without complications: Secondary | ICD-10-CM | POA: Diagnosis not present

## 2019-08-22 DIAGNOSIS — K219 Gastro-esophageal reflux disease without esophagitis: Secondary | ICD-10-CM

## 2019-08-22 DIAGNOSIS — Z0001 Encounter for general adult medical examination with abnormal findings: Secondary | ICD-10-CM | POA: Insufficient documentation

## 2019-08-22 DIAGNOSIS — F419 Anxiety disorder, unspecified: Secondary | ICD-10-CM

## 2019-08-22 DIAGNOSIS — F329 Major depressive disorder, single episode, unspecified: Secondary | ICD-10-CM

## 2019-08-22 DIAGNOSIS — M797 Fibromyalgia: Secondary | ICD-10-CM

## 2019-08-22 DIAGNOSIS — Z1211 Encounter for screening for malignant neoplasm of colon: Secondary | ICD-10-CM | POA: Diagnosis not present

## 2019-08-22 MED ORDER — METFORMIN HCL ER 500 MG PO TB24: 500.0000 mg | ORAL_TABLET | Freq: Every day | ORAL | 3 refills | Status: DC

## 2019-08-22 NOTE — Assessment & Plan Note (Signed)
Tetanus due, will be getting Covid vaccine in two days so will provide this at next visit. Pneumonia vaccination next visit. Mammogram UTD. Colonoscopy due, orders placed. Encouraged a health diet and regular exercise. Exam today unremarkable. Labs reviewed.

## 2019-08-22 NOTE — Assessment & Plan Note (Signed)
Doing well on fluoxetine 40mg, continue same

## 2019-08-22 NOTE — Progress Notes (Signed)
Subjective:    Patient ID: Susan Morales, female    DOB: July 17, 1970, 49 y.o.   MRN: [id number]  HPI  This visit occurred during the SARS-CoV-2 public health emergency.  Safety protocols were in place, including screening questions prior to the visit, additional usage of staff PPE, and extensive cleaning of exam room while observing appropriate contact time as indicated for disinfecting solutions.   Susan Morales is a 49 year old female who presents today for complete physical.  Immunizations: -Tetanus: Unsure, due today -Influenza: Declines   Diet: She endorses a healthy diet since January. Has been calorie counting. Exercise: She is walking one mile daily since January 2021  Eye exam: Completed in 2021 Dental exam: Completes semi-annually   Pap Smear: Hysterectomy  Mammogram: Completed in September 2020 Colonoscopy: Due  The 10-year ASCVD risk score Mikey Bussing DC Brooke Bonito., et al., 2013) is: 1.3%   Values used to calculate the score:     Age: 34 years     Sex: Female     Is Non-Hispanic African American: No     Diabetic: No     Tobacco smoker: No     Systolic Blood Pressure: [id number] mmHg     Is BP treated: No     HDL Cholesterol: 50.4 mg/dL     Total Cholesterol: 209 mg/dL  . BP Readings from Last 3 Encounters:  08/22/19 124/80  12/30/18 135/71  12/18/18 (P) 137/69   Wt Readings from Last 3 Encounters:  08/22/19 280 lb 4 oz (127.1 kg)  12/30/18 270 lb (122.5 kg)  12/12/18 275 lb (124.7 kg)     Review of Systems  Constitutional: Negative for unexpected weight change.  HENT: Negative for rhinorrhea.   Respiratory: Negative for cough and shortness of breath.   Cardiovascular: Negative for chest pain.  Gastrointestinal: Negative for constipation and diarrhea.  Genitourinary: Negative for difficulty urinating.  Musculoskeletal: Positive for back pain.  Skin: Negative for rash.  Allergic/Immunologic: Negative for environmental allergies.  Neurological: Positive for headaches.  Negative for dizziness and numbness.  Psychiatric/Behavioral: The patient is not nervous/anxious.        Past Medical History:  Diagnosis Date  . Complication of anesthesia    Deviate septum, combative  . Depression   . GERD (gastroesophageal reflux disease)   . Hemorrhoid   . UTI (urinary tract infection)      Social History   Socioeconomic History  . Marital status: Married    Spouse name: Not on file  . Number of children: Not on file  . Years of education: Not on file  . Highest education level: Not on file  Occupational History  . Not on file  Tobacco Use  . Smoking status: Never Smoker  . Smokeless tobacco: Never Used  Substance and Sexual Activity  . Alcohol use: No    Alcohol/week: 0.0 standard drinks  . Drug use: No  . Sexual activity: Not on file  Other Topics Concern  . Not on file  Social History Narrative   Married.   4 children.   Works as a Print production planner.   Enjoys reading, spending time with her family.   Social Determinants of Health   Financial Resource Strain:   . Difficulty of Paying Living Expenses: Not on file  Food Insecurity:   . Worried About Charity fundraiser in the Last Year: Not on file  . Ran Out of Food in the Last Year: Not on file  Transportation Needs:   .  Lack of Transportation (Medical): Not on file  . Lack of Transportation (Non-Medical): Not on file  Physical Activity:   . Days of Exercise per Week: Not on file  . Minutes of Exercise per Session: Not on file  Stress:   . Feeling of Stress : Not on file  Social Connections:   . Frequency of Communication with Friends and Family: Not on file  . Frequency of Social Gatherings with Friends and Family: Not on file  . Attends Religious Services: Not on file  . Active Member of Clubs or Organizations: Not on file  . Attends Archivist Meetings: Not on file  . Marital Status: Not on file  Intimate Partner Violence:   . Fear of Current or Ex-Partner: Not on  file  . Emotionally Abused: Not on file  . Physically Abused: Not on file  . Sexually Abused: Not on file    Past Surgical History:  Procedure Laterality Date  . ABLATION  2015  . DIRECT LARYNGOSCOPY N/A 01/29/2013   Procedure: DIRECT LARYNGOSCOPY, esophagoscopy, bronchoscopy, ;  Surgeon: Ruby Cola, MD;  Location: [facility];  Service: ENT;  Laterality: N/A;  . LAPAROSCOPIC VAGINAL HYSTERECTOMY WITH SALPINGECTOMY Bilateral 12/17/2018   Procedure: LAPAROSCOPIC ASSISTED VAGINAL HYSTERECTOMY WITH SALPINGECTOMY;  Surgeon: Dian Queen, MD;  Location: [facility];  Service: Gynecology;  Laterality: Bilateral;    Family History  Adopted: Yes  Problem Relation Age of Onset  . Other Other        Adopted    No Known Allergies  Current Outpatient Medications on File Prior to Visit  Medication Sig Dispense Refill  . cetirizine (ZYRTEC) 10 MG tablet Take 10 mg by mouth daily.    Marland Kitchen FLUoxetine (PROZAC) 20 MG tablet TAKE 2 TABLETS (40 MG TOTAL) BY MOUTH DAILY. FOR ANXIETY. 180 tablet 0  . omeprazole (PRILOSEC) 40 MG capsule Take 1 capsule (40 mg total) by mouth daily. 90 capsule 0  . OVER THE COUNTER MEDICATION Take 1 tablet by mouth daily. Fibro-Response    . hydrOXYzine (ATARAX/VISTARIL) 10 MG tablet Take 1-2 tablets (10-20 mg total) by mouth 2 (two) times daily as needed for anxiety. (Patient not taking: Reported on 12/10/2018) 30 tablet 0   No current facility-administered medications on file prior to visit.    BP 124/80   Pulse 84   Temp (!) 97.3 F (36.3 C) (Temporal)   Ht 5\' 4"  (1.626 m)   Wt 280 lb 4 oz (127.1 kg)   SpO2 98%   BMI 48.10 kg/m    Objective:   Physical Exam  Constitutional: She is oriented to person, place, and time. She appears well-nourished.  HENT:  Right Ear: Tympanic membrane and ear canal normal.  Left Ear: Tympanic membrane and ear canal normal.  Mouth/Throat: Oropharynx is clear and moist.  Eyes: Pupils are equal, round, and reactive  to light. EOM are normal.  Cardiovascular: Normal rate and regular rhythm.  Respiratory: Effort normal and breath sounds normal.  GI: Soft. Bowel sounds are normal. There is no abdominal tenderness.  Musculoskeletal:        General: Normal range of motion.     Cervical back: Neck supple.  Neurological: She is alert and oriented to person, place, and time. No cranial nerve deficit.  Reflex Scores:      Patellar reflexes are 2+ on the right side and 2+ on the left side. Skin: Skin is warm and dry.  Psychiatric: She has a normal mood and affect.  Assessment & Plan:

## 2019-08-22 NOTE — Assessment & Plan Note (Signed)
Taking omeprazole 40 mg daily (two 20 mg tablets), discussed to try weaning down to 20 mg for 4-6 weeks, then every other day, then eventually off. She will update.

## 2019-08-22 NOTE — Assessment & Plan Note (Signed)
Working with chiropractor and has noticed improved back pain and headaches.

## 2019-08-22 NOTE — Assessment & Plan Note (Signed)
New diagnosis with A1C of 6.6. Discussed diabetes diagnosis and treatment.  She is already very motivated and is working on her diet and exercise.  She did agree to Metformin XR 500 mg which was sent to pharmacy.  LDL above goal, she would like to work on lifestyle changes. Repeat lipids next visit.  Foot exam next visit. Pneumonia vaccination next visit. Urine microalbumin next visit. Eye exam UTD.  Follow up in 4 months.

## 2019-08-22 NOTE — Patient Instructions (Addendum)
Start metformin XR 500 mg once daily with breakfast for diabetes.  Continue exercising. You should be getting 150 minutes of moderate intensity exercise weekly.  Continue to work on a healthy diet. Ensure you are consuming 64 ounces of water daily.  You will be contacted regarding your referral to GI for the colonoscopy.  Please let us know if you have not been contacted within two weeks.   Please schedule a follow up appointment in 4 months for diabetes check.  It was a pleasure to see you today!   Preventive Care 30-68 Years Old, Female Preventive care refers to visits with your health care provider and lifestyle choices that can promote health and wellness. This includes:  A yearly physical exam. This may also be called an annual well check.  Regular dental visits and eye exams.  Immunizations.  Screening for certain conditions.  Healthy lifestyle choices, such as eating a healthy diet, getting regular exercise, not using drugs or products that contain nicotine and tobacco, and limiting alcohol use. What can I expect for my preventive care visit? Physical exam Your health care provider will check your:  Height and weight. This may be used to calculate body mass index (BMI), which tells if you are at a healthy weight.  Heart rate and blood pressure.  Skin for abnormal spots. Counseling Your health care provider may ask you questions about your:  Alcohol, tobacco, and drug use.  Emotional well-being.  Home and relationship well-being.  Sexual activity.  Eating habits.  Work and work Statistician.  Method of birth control.  Menstrual cycle.  Pregnancy history. What immunizations do I need?  Influenza (flu) vaccine  This is recommended every year. Tetanus, diphtheria, and pertussis (Tdap) vaccine  You may need a Td booster every 10 years. Varicella (chickenpox) vaccine  You may need this if you have not been vaccinated. Zoster (shingles) vaccine  You  may need this after age 13. Measles, mumps, and rubella (MMR) vaccine  You may need at least one dose of MMR if you were born in 1957 or later. You may also need a second dose. Pneumococcal conjugate (PCV13) vaccine  You may need this if you have certain conditions and were not previously vaccinated. Pneumococcal polysaccharide (PPSV23) vaccine  You may need one or two doses if you smoke cigarettes or if you have certain conditions. Meningococcal conjugate (MenACWY) vaccine  You may need this if you have certain conditions. Hepatitis A vaccine  You may need this if you have certain conditions or if you travel or work in places where you may be exposed to hepatitis A. Hepatitis B vaccine  You may need this if you have certain conditions or if you travel or work in places where you may be exposed to hepatitis B. Haemophilus influenzae type b (Hib) vaccine  You may need this if you have certain conditions. Human papillomavirus (HPV) vaccine  If recommended by your health care provider, you may need three doses over 6 months. You may receive vaccines as individual doses or as more than one vaccine together in one shot (combination vaccines). Talk with your health care provider about the risks and benefits of combination vaccines. What tests do I need? Blood tests  Lipid and cholesterol levels. These may be checked every 5 years, or more frequently if you are over 16 years old.  Hepatitis C test.  Hepatitis B test. Screening  Lung cancer screening. You may have this screening every year starting at age 5 if you  have a 30-pack-year history of smoking and currently smoke or have quit within the past 15 years.  Colorectal cancer screening. All adults should have this screening starting at age 7 and continuing until age 31. Your health care provider may recommend screening at age 65 if you are at increased risk. You will have tests every 1-10 years, depending on your results and the  type of screening test.  Diabetes screening. This is done by checking your blood sugar (glucose) after you have not eaten for a while (fasting). You may have this done every 1-3 years.  Mammogram. This may be done every 1-2 years. Talk with your health care provider about when you should start having regular mammograms. This may depend on whether you have a family history of breast cancer.  BRCA-related cancer screening. This may be done if you have a family history of breast, ovarian, tubal, or peritoneal cancers.  Pelvic exam and Pap test. This may be done every 3 years starting at age 44. Starting at age 58, this may be done every 5 years if you have a Pap test in combination with an HPV test. Other tests  Sexually transmitted disease (STD) testing.  Bone density scan. This is done to screen for osteoporosis. You may have this scan if you are at high risk for osteoporosis. Follow these instructions at home: Eating and drinking  Eat a diet that includes fresh fruits and vegetables, whole grains, lean protein, and low-fat dairy.  Take vitamin and mineral supplements as recommended by your health care provider.  Do not drink alcohol if: ? Your health care provider tells you not to drink. ? You are pregnant, may be pregnant, or are planning to become pregnant.  If you drink alcohol: ? Limit how much you have to 0-1 drink a day. ? Be aware of how much alcohol is in your drink. In the [place] one drink equals one 12 oz bottle of beer (355 mL), one 5 oz glass of wine (148 mL), or one 1 oz glass of hard liquor (44 mL). Lifestyle  Take daily care of your teeth and gums.  Stay active. Exercise for at least 30 minutes on 5 or more days each week.  Do not use any products that contain nicotine or tobacco, such as cigarettes, e-cigarettes, and chewing tobacco. If you need help quitting, ask your health care provider.  If you are sexually active, practice safe sex. Use a condom or other form  of birth control (contraception) in order to prevent pregnancy and STIs (sexually transmitted infections).  If told by your health care provider, take low-dose aspirin daily starting at age 66. What's next?  Visit your health care provider once a year for a well check visit.  Ask your health care provider how often you should have your eyes and teeth checked.  Stay up to date on all vaccines. This information is not intended to replace advice given to you by your health care provider. Make sure you discuss any questions you have with your health care provider. Document Revised: 02/22/2018 Document Reviewed: 02/22/2018 Elsevier Patient Education  2020 [employer].

## 2019-10-14 ENCOUNTER — Encounter: Payer: Self-pay | Admitting: Family Medicine

## 2019-10-14 ENCOUNTER — Telehealth (INDEPENDENT_AMBULATORY_CARE_PROVIDER_SITE_OTHER): Payer: Managed Care, Other (non HMO) | Admitting: Family Medicine

## 2019-10-14 VITALS — Temp 97.7°F | Ht 64.0 in

## 2019-10-14 DIAGNOSIS — J0100 Acute maxillary sinusitis, unspecified: Secondary | ICD-10-CM

## 2019-10-14 MED ORDER — AMOXICILLIN-POT CLAVULANATE 875-125 MG PO TABS: 1.0000 | ORAL_TABLET | Freq: Two times a day (BID) | ORAL | 0 refills | Status: AC

## 2019-10-14 MED ORDER — FLUCONAZOLE 150 MG PO TABS: ORAL_TABLET | ORAL | 0 refills | Status: DC

## 2019-10-14 NOTE — Progress Notes (Signed)
     [name], MD Primary Care and Sports Medicine [facility] [place], [address] Phone: [phone]  FAX: [phone]  Susan Morales - 49 y.o. female  MRN [id number]  Date of Birth: Oct 21, 1970  Visit Date: 10/14/2019  PCP: Pleas Koch, NP  Referred by: Pleas Koch, NP Chief Complaint  Patient presents with  . Sinus Pressure  . Scratchy Throat  . Ears Itching   Virtual Visit via Video Note:  I connected with  [name] on 10/14/2019  3:20 PM EDT by a video enabled telemedicine application and verified that I am speaking with the correct person using two identifiers.   Location patient: home computer, tablet, or smartphone Location provider: work or home office Consent: Verbal consent directly obtained from [name]. Persons participating in the virtual visit: patient, provider  I discussed the limitations of evaluation and management by telemedicine and the availability of in person appointments. The patient expressed understanding and agreed to proceed.  Interactive audio and video telecommunications were attempted between this provider and patient, however failed, due to patient having technical difficulties OR patient did not have access to video capability.  We continued and completed visit with audio only.    History of Present Illness:  > 2 weeks, eyes matted.  Now with a lot of pressure in her sinuses. Drainage is worse.  Cough at night.  Ears all feel scratchy.  She is not having any fever or chills.  She is able to eat and drink okay.  She is not having any nauseousness or vomiting.  No rashes.  Review of Systems as above: See pertinent positives and pertinent negatives per HPI No acute distress verbally   Observations/Objective/Exam:  An attempt was made to discern vital signs over the phone and per patient if applicable and possible.   General:    Alert, Oriented,  appears well and in no acute distress  Pulmonary:     On inspection no signs of respiratory distress.  Psych / Neurological:     Pleasant and cooperative.  Assessment and Plan:    ICD-10-CM   1. Acute non-recurrent maxillary sinusitis  J01.00    14 days, pretty classic symptoms.  Treat as such.  Supportive care.  Continue antiallergy medication.  I discussed the assessment and treatment plan with the patient. The patient was provided an opportunity to ask questions and all were answered. The patient agreed with the plan and demonstrated an understanding of the instructions.   The patient was advised to call back or seek an in-person evaluation if the symptoms worsen or if the condition fails to improve as anticipated.  Follow-up: prn unless noted otherwise below No follow-ups on file.  Meds ordered this encounter  Medications  . amoxicillin-clavulanate (AUGMENTIN) 875-125 MG tablet    Sig: Take 1 tablet by mouth 2 (two) times daily for 10 days.    Dispense:  20 tablet    Refill:  0  . fluconazole (DIFLUCAN) 150 MG tablet    Sig: Take 1 tab po today, repeat in 7 days if needed    Dispense:  2 tablet    Refill:  0   No orders of the defined types were placed in this encounter.   Signed,  Maud Deed. Aaliayah Miao, MD

## 2019-10-16 DIAGNOSIS — R059 Cough, unspecified: Secondary | ICD-10-CM

## 2019-10-16 DIAGNOSIS — J0100 Acute maxillary sinusitis, unspecified: Secondary | ICD-10-CM

## 2019-10-16 DIAGNOSIS — R05 Cough: Secondary | ICD-10-CM

## 2019-10-17 MED ORDER — BENZONATATE 200 MG PO CAPS: 200.0000 mg | ORAL_CAPSULE | Freq: Three times a day (TID) | ORAL | 0 refills | Status: DC | PRN

## 2019-10-28 ENCOUNTER — Encounter: Payer: Self-pay | Admitting: Primary Care

## 2019-11-16 ENCOUNTER — Other Ambulatory Visit: Payer: Self-pay | Admitting: Primary Care

## 2019-11-16 DIAGNOSIS — F32A Depression, unspecified: Secondary | ICD-10-CM

## 2019-12-20 ENCOUNTER — Ambulatory Visit: Payer: Managed Care, Other (non HMO) | Admitting: Primary Care

## 2020-02-25 DIAGNOSIS — R059 Cough, unspecified: Secondary | ICD-10-CM

## 2020-02-25 DIAGNOSIS — J0100 Acute maxillary sinusitis, unspecified: Secondary | ICD-10-CM

## 2020-02-26 ENCOUNTER — Telehealth: Payer: Managed Care, Other (non HMO) | Admitting: Primary Care

## 2020-02-27 MED ORDER — BENZONATATE 200 MG PO CAPS: 200.0000 mg | ORAL_CAPSULE | Freq: Three times a day (TID) | ORAL | 0 refills | Status: DC | PRN

## 2020-03-06 ENCOUNTER — Telehealth (INDEPENDENT_AMBULATORY_CARE_PROVIDER_SITE_OTHER): Payer: Managed Care, Other (non HMO) | Admitting: Primary Care

## 2020-03-06 DIAGNOSIS — U071 COVID-19: Secondary | ICD-10-CM

## 2020-03-06 HISTORY — DX: COVID-19: U07.1

## 2020-03-06 NOTE — Telephone Encounter (Signed)
Will you put her on my schedule in the open slot? Plan to meet with her around 12:30 to 12:45

## 2020-03-06 NOTE — Assessment & Plan Note (Signed)
Diagnosed with COVID-19 on 02/26/20, overall seems to be improving. Agree to return to work date for March 11, 2020. She will notify if she requires a work note. Continue benzonatate capsules, albuterol as needed. She will update if symptoms do not continue to improve.

## 2020-03-06 NOTE — Patient Instructions (Signed)
Continue using your albuterol inhaler as needed for wheezing/chest tightness.  You may take the benzonatate capsules every 8 hours as needed for cough.  Please update me if your symptoms progress/do not improve.  It was a pleasure to see you today! Allie Bossier, NP-C

## 2020-03-06 NOTE — Progress Notes (Signed)
Subjective:    Patient ID: Susan Morales, female    DOB: 05/26/1971, 49 y.o.   MRN: [id number]  HPI  Virtual Visit via Video Note  I connected with Susan Morales on 03/06/20 at  2:20 PM EDT by a video enabled telemedicine application and verified that I am speaking with the correct person using two identifiers.  Location: Patient: Home Provider: Office Participants: Patient and myself   I discussed the limitations of evaluation and management by telemedicine and the availability of in person appointments. The patient expressed understanding and agreed to proceed.  History of Present Illness:  [name] is a 49 year old female with a history of type 2 diabetes, anxiety and depression, fibromyalgia, chronic cough, fatigue, Covid-19 infection who presents today for follow up of Covid-19.  Diagnosed with Covid-19 on 02/26/20 with symptom onset being 02/24/20. Currently she's experiencing symptoms of headaches, cough, shortness of breath/chest tightness, fatigue.  Overall she feels she is getting better.  She's using her albuterol inhaler as needed for chest tightness, benzonatate capsules, and Dayquil with improvement.  She has several questions about her diagnosis, also questioning when she can return to work. She would like to return to work next Wednesday 03/11/20 given continued fatigue.  She does not believe she needs a work note at this point.   Observations/Objective:  Alert and oriented. Appears well, not sickly. No distress. Speaking in complete sentences. No cough during visit.  Assessment and Plan:  Diagnosed with COVID-19 on 02/26/20, overall seems to be improving. Agree to return to work date for March 11, 2020. She will notify if she requires a work note. Continue benzonatate capsules, albuterol as needed. She will update if symptoms do not continue to improve.  Follow Up Instructions:  Continue using your albuterol inhaler as needed for wheezing/chest  tightness.  You may take the benzonatate capsules every 8 hours as needed for cough.  Please update me if your symptoms progress/do not improve.  It was a pleasure to see you today! Allie Bossier, NP-C    I discussed the assessment and treatment plan with the patient. The patient was provided an opportunity to ask questions and all were answered. The patient agreed with the plan and demonstrated an understanding of the instructions.   The patient was advised to call back or seek an in-person evaluation if the symptoms worsen or if the condition fails to improve as anticipated.    Pleas Koch, NP    Review of Systems  Constitutional: Positive for fatigue. Negative for chills and fever.  HENT: Negative for congestion.   Respiratory: Positive for cough, shortness of breath and wheezing.   Musculoskeletal: Negative for arthralgias and myalgias.        Past Medical History:  Diagnosis Date  . Adenomyosis 12/17/2018  . Complication of anesthesia    Deviate septum, combative  . Depression   . GERD (gastroesophageal reflux disease)   . Hemorrhoid   . Pelvic pain 12/17/2018  . UTI (urinary tract infection)      Social History   Socioeconomic History  . Marital status: Married    Spouse name: Not on file  . Number of children: Not on file  . Years of education: Not on file  . Highest education level: Not on file  Occupational History  . Not on file  Tobacco Use  . Smoking status: Never Smoker  . Smokeless tobacco: Never Used  Vaping Use  . Vaping Use: Never used  Substance  and Sexual Activity  . Alcohol use: No    Alcohol/week: 0.0 standard drinks  . Drug use: No  . Sexual activity: Not on file  Other Topics Concern  . Not on file  Social History Narrative   Married.   4 children.   Works as a Print production planner.   Enjoys reading, spending time with her family.   Social Determinants of Health   Financial Resource Strain:   . Difficulty of Paying Living  Expenses: Not on file  Food Insecurity:   . Worried About Charity fundraiser in the Last Year: Not on file  . Ran Out of Food in the Last Year: Not on file  Transportation Needs:   . Lack of Transportation (Medical): Not on file  . Lack of Transportation (Non-Medical): Not on file  Physical Activity:   . Days of Exercise per Week: Not on file  . Minutes of Exercise per Session: Not on file  Stress:   . Feeling of Stress : Not on file  Social Connections:   . Frequency of Communication with Friends and Family: Not on file  . Frequency of Social Gatherings with Friends and Family: Not on file  . Attends Religious Services: Not on file  . Active Member of Clubs or Organizations: Not on file  . Attends Archivist Meetings: Not on file  . Marital Status: Not on file  Intimate Partner Violence:   . Fear of Current or Ex-Partner: Not on file  . Emotionally Abused: Not on file  . Physically Abused: Not on file  . Sexually Abused: Not on file    Past Surgical History:  Procedure Laterality Date  . ABLATION  2015  . DIRECT LARYNGOSCOPY N/A 01/29/2013   Procedure: DIRECT LARYNGOSCOPY, esophagoscopy, bronchoscopy, ;  Surgeon: Ruby Cola, MD;  Location: [facility];  Service: ENT;  Laterality: N/A;  . LAPAROSCOPIC VAGINAL HYSTERECTOMY WITH SALPINGECTOMY Bilateral 12/17/2018   Procedure: LAPAROSCOPIC ASSISTED VAGINAL HYSTERECTOMY WITH SALPINGECTOMY;  Surgeon: Dian Queen, MD;  Location: [facility];  Service: Gynecology;  Laterality: Bilateral;    Family History  Adopted: Yes  Problem Relation Age of Onset  . Other Other        Adopted    No Known Allergies  Current Outpatient Medications on File Prior to Visit  Medication Sig Dispense Refill  . benzonatate (TESSALON) 200 MG capsule Take 1 capsule (200 mg total) by mouth 3 (three) times daily as needed for cough. 15 capsule 0  . cetirizine (ZYRTEC) 10 MG tablet Take 30 mg by mouth daily.     . fluconazole  (DIFLUCAN) 150 MG tablet Take 1 tab po today, repeat in 7 days if needed 2 tablet 0  . FLUoxetine (PROZAC) 20 MG tablet TAKE 2 TABLETS (40 MG TOTAL) BY MOUTH DAILY. FOR ANXIETY. 180 tablet 1  . hydrOXYzine (ATARAX/VISTARIL) 10 MG tablet Take 1-2 tablets (10-20 mg total) by mouth 2 (two) times daily as needed for anxiety. 30 tablet 0  . metFORMIN (GLUCOPHAGE XR) 500 MG 24 hr tablet Take 1 tablet (500 mg total) by mouth daily with breakfast. For diabetes. 90 tablet 3  . omeprazole (PRILOSEC) 40 MG capsule Take 1 capsule (40 mg total) by mouth daily. 90 capsule 0  . OVER THE COUNTER MEDICATION Take 1 tablet by mouth daily. Fibro-Response     No current facility-administered medications on file prior to visit.    Ht 5\' 4"  (1.626 m)   Wt 280 lb (127 kg) Comment:  per patient  BMI 48.06 kg/m    Objective:   Physical Exam Constitutional:      Appearance: She is not ill-appearing.  Pulmonary:     Effort: Pulmonary effort is normal.     Comments: No cough during visit Neurological:     Mental Status: She is alert and oriented to person, place, and time.            Assessment & Plan:

## 2020-03-06 NOTE — Telephone Encounter (Signed)
Appointment scheduled.

## 2020-05-21 ENCOUNTER — Other Ambulatory Visit: Payer: Self-pay | Admitting: Primary Care

## 2020-05-21 DIAGNOSIS — F32A Depression, unspecified: Secondary | ICD-10-CM

## 2020-05-21 DIAGNOSIS — F419 Anxiety disorder, unspecified: Secondary | ICD-10-CM

## 2020-05-22 NOTE — Telephone Encounter (Signed)
Refills sent to pharmacy. 

## 2020-05-22 NOTE — Telephone Encounter (Signed)
Refill request for pending Rx last OV September 2021. Please advise.

## 2020-08-20 ENCOUNTER — Other Ambulatory Visit: Payer: Self-pay | Admitting: Primary Care

## 2020-08-20 DIAGNOSIS — F32A Depression, unspecified: Secondary | ICD-10-CM

## 2020-09-10 ENCOUNTER — Other Ambulatory Visit: Payer: Self-pay | Admitting: Obstetrics and Gynecology

## 2020-09-10 DIAGNOSIS — Z1231 Encounter for screening mammogram for malignant neoplasm of breast: Secondary | ICD-10-CM

## 2020-09-20 DIAGNOSIS — Z87898 Personal history of other specified conditions: Secondary | ICD-10-CM

## 2020-09-23 MED ORDER — SCOPOLAMINE 1 MG/3DAYS TD PT72: MEDICATED_PATCH | TRANSDERMAL | 0 refills | Status: DC

## 2020-09-23 NOTE — Telephone Encounter (Signed)
Need to make sure she schedules for both her and her husband Susan Morales. I asked her to do so, please monitor.

## 2020-09-28 NOTE — Telephone Encounter (Signed)
Called patient to schedule follow up visit. LVM to call back and schedule patient and husband.

## 2020-09-29 NOTE — Telephone Encounter (Signed)
Called patient to schedule appointment. Line kept ringing. Unable to connect with patient to schedule for patient and husband.

## 2020-11-04 ENCOUNTER — Other Ambulatory Visit: Payer: Self-pay

## 2020-11-04 ENCOUNTER — Ambulatory Visit
Admission: RE | Admit: 2020-11-04 | Discharge: 2020-11-04 | Disposition: A | Discharge status: Home / Self Care | Payer: Managed Care, Other (non HMO) | Source: Ambulatory Visit | Attending: Obstetrics and Gynecology | Admitting: Obstetrics and Gynecology

## 2020-11-04 DIAGNOSIS — Z1231 Encounter for screening mammogram for malignant neoplasm of breast: Secondary | ICD-10-CM

## 2020-11-12 ENCOUNTER — Ambulatory Visit: Payer: Managed Care, Other (non HMO) | Admitting: Family Medicine

## 2020-11-12 ENCOUNTER — Other Ambulatory Visit: Payer: Self-pay

## 2020-11-12 ENCOUNTER — Encounter: Payer: Self-pay | Admitting: Family Medicine

## 2020-11-12 VITALS — BP 136/80 | HR 87 | Temp 98.2°F | Ht 64.0 in | Wt 245.0 lb

## 2020-11-12 DIAGNOSIS — R21 Rash and other nonspecific skin eruption: Secondary | ICD-10-CM

## 2020-11-12 DIAGNOSIS — W57XXXA Bitten or stung by nonvenomous insect and other nonvenomous arthropods, initial encounter: Secondary | ICD-10-CM | POA: Diagnosis not present

## 2020-11-12 DIAGNOSIS — S80862A Insect bite (nonvenomous), left lower leg, initial encounter: Secondary | ICD-10-CM | POA: Diagnosis not present

## 2020-11-12 MED ORDER — DOXYCYCLINE HYCLATE 100 MG PO TABS: 100.0000 mg | ORAL_TABLET | Freq: Two times a day (BID) | ORAL | 0 refills | Status: AC

## 2020-11-12 NOTE — Progress Notes (Signed)
Susan Kimmons T. Gerrick Ray, MD, [name]  [facility] and [place] at [facility] [place], [address]  Phone: [phone]  FAX: [phone]  Susan Morales - 50 y.o. female  MRN [id number]  Date of Birth: 31-Jan-1971  Date: 11/12/2020  PCP: Pleas Koch, NP  Referral: Pleas Koch, NP  Chief Complaint  Patient presents with  . Tick Removal    Patient removed at home. Just has redness around the area where bitten.     This visit occurred during the SARS-CoV-2 public health emergency.  Safety protocols were in place, including screening questions prior to the visit, additional usage of staff PPE, and extensive cleaning of exam room while observing appropriate contact time as indicated for disinfecting solutions.   Subjective:   Susan Morales is a 50 y.o. very pleasant female patient with Body mass index is 42.05 kg/m. who presents with the following:  Last Thursday, found a small tick.  Posterior aspect of the thigh.  Seen below.  Unclear the length of time that this was on her leg.  She does not have any sort of systemic fevers or chills.  No arthralgia.  She does have a history of having tickborne illness in the past.  There is an area that is small and reddened consistent with a tick bite, and that there is some surrounding bruising without a clear ring.  Review of Systems is noted in the HPI, as appropriate  Objective:   BP 136/80   Pulse 87   Temp 98.2 F (36.8 C) (Temporal)   Ht 5\' 4"  (1.626 m)   Wt 245 lb (111.1 kg)   SpO2 97%   BMI 42.05 kg/m   GEN: No acute distress; alert,appropriate. PULM: Breathing comfortably in no respiratory distress PSYCH: Normally interactive.     It is not hot or warm.  Laboratory and Imaging Data:  Assessment and Plan:     ICD-10-CM   1. Rash  R21   2. Tick bite of left lower leg, initial encounter  G99.242A    W57.Merril Abbe    I think that she  is fine to watch this and see how she does over the next few days to week.  Given acute COVID-19 outbreak, I am going to give her some doxycycline to hold in case she worsens over this time.  She understands that she will have some global symptoms like she did before, and she is comfortable holding these until that time.  Meds ordered this encounter  Medications  . doxycycline (VIBRA-TABS) 100 MG tablet    Sig: Take 1 tablet (100 mg total) by mouth 2 (two) times daily for 10 days.    Dispense:  20 tablet    Refill:  0   Medications Discontinued During This Encounter  Medication Reason  . benzonatate (TESSALON) 200 MG capsule No longer needed (for PRN medications)  . fluconazole (DIFLUCAN) 150 MG tablet No longer needed (for PRN medications)  . metFORMIN (GLUCOPHAGE XR) 500 MG 24 hr tablet No longer needed (for PRN medications)  . scopolamine (TRANSDERM-SCOP) 1 MG/3DAYS No longer needed (for PRN medications)  . OVER THE COUNTER MEDICATION No longer needed (for PRN medications)   No orders of the defined types were placed in this encounter.   Follow-up: No follow-ups on file.  Signed,  Maud Deed. Vandell Kun, MD   Outpatient Encounter Medications as of 11/12/2020  Medication Sig  . cetirizine (ZYRTEC) 10 MG  tablet Take 30 mg by mouth daily.   Marland Kitchen doxycycline (VIBRA-TABS) 100 MG tablet Take 1 tablet (100 mg total) by mouth 2 (two) times daily for 10 days.  Marland Kitchen FLUoxetine (PROZAC) 20 MG tablet TAKE 2 TABLETS BY MOUTH EVERY DAY FOR ANXIETY  . hydrOXYzine (ATARAX/VISTARIL) 10 MG tablet Take 1-2 tablets (10-20 mg total) by mouth 2 (two) times daily as needed for anxiety.  Marland Kitchen omeprazole (PRILOSEC) 40 MG capsule Take 1 capsule (40 mg total) by mouth daily.  . [DISCONTINUED] benzonatate (TESSALON) 200 MG capsule Take 1 capsule (200 mg total) by mouth 3 (three) times daily as needed for cough.  . [DISCONTINUED] fluconazole (DIFLUCAN) 150 MG tablet Take 1 tab po today, repeat in 7 days if needed  .  [DISCONTINUED] metFORMIN (GLUCOPHAGE XR) 500 MG 24 hr tablet Take 1 tablet (500 mg total) by mouth daily with breakfast. For diabetes.  . [DISCONTINUED] OVER THE COUNTER MEDICATION Take 1 tablet by mouth daily. Fibro-Response  . [DISCONTINUED] scopolamine (TRANSDERM-SCOP) 1 MG/3DAYS Place one patch on a hairless area behind the ear every 72 hours as needed for motion sickness.   No facility-administered encounter medications on file as of 11/12/2020.

## 2020-11-19 ENCOUNTER — Telehealth: Payer: Self-pay | Admitting: Primary Care

## 2020-11-19 DIAGNOSIS — F419 Anxiety disorder, unspecified: Secondary | ICD-10-CM

## 2020-11-19 DIAGNOSIS — F32A Depression, unspecified: Secondary | ICD-10-CM

## 2020-11-19 NOTE — Telephone Encounter (Signed)
Patient has not been seen by me since February 2021, she must be seen in the office for CPE/follow-up for additional refills.  Let me know when this has been scheduled. Okay to fit her in anywhere.

## 2020-11-23 ENCOUNTER — Emergency Department (HOSPITAL_BASED_OUTPATIENT_CLINIC_OR_DEPARTMENT_OTHER): Payer: Managed Care, Other (non HMO) | Admitting: Radiology

## 2020-11-23 ENCOUNTER — Emergency Department (HOSPITAL_BASED_OUTPATIENT_CLINIC_OR_DEPARTMENT_OTHER)
Admission: EM | Admit: 2020-11-23 | Discharge: 2020-11-23 | Disposition: A | Discharge status: Home / Self Care | Payer: Managed Care, Other (non HMO) | Attending: Emergency Medicine | Admitting: Emergency Medicine

## 2020-11-23 ENCOUNTER — Other Ambulatory Visit: Payer: Self-pay

## 2020-11-23 ENCOUNTER — Encounter (HOSPITAL_BASED_OUTPATIENT_CLINIC_OR_DEPARTMENT_OTHER): Payer: Self-pay | Admitting: Obstetrics and Gynecology

## 2020-11-23 DIAGNOSIS — R0789 Other chest pain: Secondary | ICD-10-CM | POA: Insufficient documentation

## 2020-11-23 DIAGNOSIS — R030 Elevated blood-pressure reading, without diagnosis of hypertension: Secondary | ICD-10-CM | POA: Insufficient documentation

## 2020-11-23 DIAGNOSIS — E119 Type 2 diabetes mellitus without complications: Secondary | ICD-10-CM | POA: Diagnosis not present

## 2020-11-23 DIAGNOSIS — Z8616 Personal history of COVID-19: Secondary | ICD-10-CM | POA: Diagnosis not present

## 2020-11-23 DIAGNOSIS — R079 Chest pain, unspecified: Secondary | ICD-10-CM

## 2020-11-23 DIAGNOSIS — R0602 Shortness of breath: Secondary | ICD-10-CM | POA: Diagnosis not present

## 2020-11-23 LAB — BASIC METABOLIC PANEL
Anion gap: 9 (ref 5–15)
BUN: 14 mg/dL (ref 6–20)
CO2: 28 mmol/L (ref 22–32)
Calcium: 9.6 mg/dL (ref 8.9–10.3)
Chloride: 102 mmol/L (ref 98–111)
Creatinine, Ser: 0.69 mg/dL (ref 0.44–1.00)
GFR, Estimated: >60 mL/min (ref >60)
Glucose, Bld: 99 mg/dL (ref 70–99)
Potassium: 4.2 mmol/L (ref 3.5–5.1)
Sodium: 139 mmol/L (ref 135–145)

## 2020-11-23 LAB — CBC
HCT: 43.4 % (ref 36.0–46.0)
Hemoglobin: 14.4 g/dL (ref 12.0–15.0)
MCH: 28.2 pg (ref 26.0–34.0)
MCHC: 33.2 g/dL (ref 30.0–36.0)
MCV: 85.1 fL (ref 80.0–100.0)
Platelets: 237 10*3/uL (ref 150–400)
RBC: 5.1 MIL/uL (ref 3.87–5.11)
RDW: 13 % (ref 11.5–15.5)
WBC: 7.6 10*3/uL (ref 4.0–10.5)
nRBC: 0 % (ref 0.0–0.2)

## 2020-11-23 LAB — TROPONIN I (HIGH SENSITIVITY)
Troponin I (High Sensitivity): <2 ng/L (ref <18)
Troponin I (High Sensitivity): <2 ng/L (ref <18)

## 2020-11-23 NOTE — ED Notes (Signed)
ED Provider at bedside. 

## 2020-11-23 NOTE — ED Triage Notes (Signed)
Patient reports to the ER for chest pain. Patient reports she has been having high blood pressure and headaches as well. Patient reports left arm discomfort and central chest discomfort

## 2020-11-23 NOTE — ED Provider Notes (Signed)
[facility] EMERGENCY DEPT Provider Note   CSN: [id number] Arrival date & time: 11/23/20  1214     History Chief Complaint  Patient presents with  . Chest Pain    Susan Morales is a 50 y.o. female.  The history is provided by the patient and medical records.  Chest Pain  Susan Morales is a 50 y.o. female who presents to the Emergency Department complaining of chest pain. She presents the emergency department for evaluation of left-sided chest discomfort, that started last night. She states that she feels a fluttering sensation/discomfort in the left upper chest. She had mild associated shortness of breath - currently resolved. She did check her blood pressure at home when she had the symptoms and noted that she had diastolic hypertension. She has no prior history of hypertension. Today she has ongoing chest discomfort but less significant than yesterday as well as persistently elevated blood pressures so she presents for evaluation. She did have a headache yesterday, currently resolved. She also had caffeine yesterday, which is unusual for her. She has no known medical problems. No recent surgeries were long travels. She does not take hormones. She is a non-smoker. She was adopted and is unaware of inherited risk factors that she may have.    Past Medical History:  Diagnosis Date  . Adenomyosis 12/17/2018  . Complication of anesthesia    Deviate septum, combative  . Depression   . GERD (gastroesophageal reflux disease)   . Hemorrhoid   . Pelvic pain 12/17/2018  . UTI (urinary tract infection)     Patient Active Problem List   Diagnosis Date Noted  . COVID-19 virus infection 03/06/2020  . Preventative health care 08/22/2019  . Chronic cough 03/01/2018  . Type 2 diabetes mellitus ([place]) 05/08/2017  . Vertigo 02/15/2017  . Fatigue 10/26/2016  . Fibromyalgia 10/26/2016  . Anxiety and depression 06/04/2015  . GERD (gastroesophageal reflux disease) 06/04/2015     Past Surgical History:  Procedure Laterality Date  . ABLATION  2015  . DIRECT LARYNGOSCOPY N/A 01/29/2013   Procedure: DIRECT LARYNGOSCOPY, esophagoscopy, bronchoscopy, ;  Surgeon: Ruby Cola, MD;  Location: [facility];  Service: ENT;  Laterality: N/A;  . LAPAROSCOPIC VAGINAL HYSTERECTOMY WITH SALPINGECTOMY Bilateral 12/17/2018   Procedure: LAPAROSCOPIC ASSISTED VAGINAL HYSTERECTOMY WITH SALPINGECTOMY;  Surgeon: Dian Queen, MD;  Location: [facility];  Service: Gynecology;  Laterality: Bilateral;     OB History    Gravida      Para      Term      Preterm      AB      Living  4     SAB      IAB      Ectopic      Multiple      Live Births              Family History  Adopted: Yes  Problem Relation Age of Onset  . Breast cancer Mother        in 34's  . Breast cancer Paternal Grandmother     Social History   Tobacco Use  . Smoking status: Never Smoker  . Smokeless tobacco: Never Used  Vaping Use  . Vaping Use: Never used  Substance Use Topics  . Alcohol use: No    Alcohol/week: 0.0 standard drinks  . Drug use: No    Home Medications Prior to Admission medications   Medication Sig Start Date End Date Taking? Authorizing Provider  cetirizine (  ZYRTEC) 10 MG tablet Take 30 mg by mouth daily.     [provider]  FLUoxetine (PROZAC) 20 MG tablet TAKE 2 TABLETS BY MOUTH EVERY DAY FOR ANXIETY 11/19/20   Pleas Koch, NP  hydrOXYzine (ATARAX/VISTARIL) 10 MG tablet Take 1-2 tablets (10-20 mg total) by mouth 2 (two) times daily as needed for anxiety. 09/14/18   Pleas Koch, NP  omeprazole (PRILOSEC) 40 MG capsule Take 1 capsule (40 mg total) by mouth daily. 03/01/18   Pleas Koch, NP    Allergies    Patient has no known allergies.  Review of Systems   Review of Systems  Cardiovascular: Positive for chest pain.  All other systems reviewed and are negative.   Physical Exam Updated Vital Signs BP (!) 148/94  (BP Location: Right Arm)   Pulse 77   Temp 98.6 F (37 C)   Resp 16   Ht 5\' 4"  (1.626 m)   Wt 108.9 kg   LMP 08/11/2018   SpO2 97%   BMI 41.20 kg/m   Physical Exam Vitals and nursing note reviewed.  Constitutional:      Appearance: She is well-developed.  HENT:     Head: Normocephalic and atraumatic.  Cardiovascular:     Rate and Rhythm: Normal rate and regular rhythm.     Heart sounds: No murmur heard.   Pulmonary:     Effort: Pulmonary effort is normal. No respiratory distress.     Breath sounds: Normal breath sounds.  Abdominal:     Palpations: Abdomen is soft.     Tenderness: There is no abdominal tenderness. There is no guarding or rebound.  Musculoskeletal:        General: No swelling or tenderness.  Skin:    General: Skin is warm and dry.  Neurological:     Mental Status: She is alert and oriented to person, place, and time.  Psychiatric:        Behavior: Behavior normal.     ED Results / Procedures / Treatments   Labs (all labs ordered are listed, but only abnormal results are displayed) Labs Reviewed  BASIC METABOLIC PANEL  CBC  TROPONIN I (HIGH SENSITIVITY)  TROPONIN I (HIGH SENSITIVITY)    EKG EKG Interpretation  Date/Time:  Monday Nov 23 2020 12:22:24 EDT Ventricular Rate:  95 PR Interval:  149 QRS Duration: 104 QT Interval:  390 QTC Calculation: 491 R Axis:   -10 Text Interpretation: Sinus rhythm Low voltage, precordial leads Borderline prolonged QT interval No previous ECGs available Confirmed by Quintella Reichert [phone]) on 11/23/2020 12:24:13 PM   Radiology DG Chest 2 View  Result Date: 11/23/2020 CLINICAL DATA:  Chest pain. EXAM: CHEST - 2 VIEW COMPARISON:  None. FINDINGS: Normal heart, mediastinum and hila. Lungs are clear.  No pleural effusion or pneumothorax. Skeletal structures are intact. IMPRESSION: No active cardiopulmonary disease. Electronically Signed   By: Lajean Manes M.D.   On: 11/23/2020 12:55    Procedures Procedures    Medications Ordered in ED Medications - No data to display  ED Course  I have reviewed the triage vital signs and the nursing notes.  Pertinent labs & imaging results that were available during my care of the patient were reviewed by me and considered in my medical decision making (see chart for details).    MDM Rules/Calculators/A&P                         patient here for  evaluation of left-sided chest pain since last night. She did have high blood pressures at home, as well as in the emergency department. EKG is without acute ischemic changes in troponin is negative times two. Presentation is not consistent with ACS, PE, dissection, hypertensive urgency. Discussed with patient unclear source of her chest discomfort, feels she is stable for discharge home at this time with close PCP follow-up. Recommend checking her blood pressures at home she may need to be initiated on antihypertensives if her hypertension continues. Discussed return precautions for chest pain.  Final Clinical Impression(s) / ED Diagnoses Final diagnoses:  Chest pain in adult  Elevated blood pressure reading in office without diagnosis of hypertension    Rx / DC Orders ED Discharge Orders    None       Quintella Reichert, MD 11/23/20 1538

## 2020-11-23 NOTE — ED Notes (Signed)
Patient transported to X-ray 

## 2020-11-23 NOTE — ED Notes (Signed)
Pt returned from xray

## 2020-11-25 NOTE — Telephone Encounter (Signed)
Error

## 2020-12-08 ENCOUNTER — Encounter: Payer: Self-pay | Admitting: Primary Care

## 2020-12-08 ENCOUNTER — Other Ambulatory Visit: Payer: Self-pay

## 2020-12-08 ENCOUNTER — Ambulatory Visit: Payer: Managed Care, Other (non HMO) | Admitting: Primary Care

## 2020-12-08 VITALS — BP 134/82 | HR 89 | Temp 97.6°F | Ht 64.0 in | Wt 239.0 lb

## 2020-12-08 DIAGNOSIS — Z0001 Encounter for general adult medical examination with abnormal findings: Secondary | ICD-10-CM | POA: Diagnosis not present

## 2020-12-08 DIAGNOSIS — Z114 Encounter for screening for human immunodeficiency virus [HIV]: Secondary | ICD-10-CM

## 2020-12-08 DIAGNOSIS — M797 Fibromyalgia: Secondary | ICD-10-CM

## 2020-12-08 DIAGNOSIS — R519 Headache, unspecified: Secondary | ICD-10-CM

## 2020-12-08 DIAGNOSIS — E119 Type 2 diabetes mellitus without complications: Secondary | ICD-10-CM

## 2020-12-08 DIAGNOSIS — F32A Depression, unspecified: Secondary | ICD-10-CM

## 2020-12-08 DIAGNOSIS — K219 Gastro-esophageal reflux disease without esophagitis: Secondary | ICD-10-CM

## 2020-12-08 DIAGNOSIS — F419 Anxiety disorder, unspecified: Secondary | ICD-10-CM

## 2020-12-08 DIAGNOSIS — Z23 Encounter for immunization: Secondary | ICD-10-CM | POA: Diagnosis not present

## 2020-12-08 DIAGNOSIS — Z1159 Encounter for screening for other viral diseases: Secondary | ICD-10-CM

## 2020-12-08 LAB — CBC
HCT: 42.7 % (ref 36.0–46.0)
Hemoglobin: 14.6 g/dL (ref 12.0–15.0)
MCHC: 34.1 g/dL (ref 30.0–36.0)
MCV: 84.5 fl (ref 78.0–100.0)
Platelets: 231 K/uL (ref 150.0–400.0)
RBC: 5.05 Mil/uL (ref 3.87–5.11)
RDW: 13.4 % (ref 11.5–15.5)
WBC: 5.8 K/uL (ref 4.0–10.5)

## 2020-12-08 LAB — LIPID PANEL
Cholesterol: 244 mg/dL — ABNORMAL HIGH (ref 0–200)
HDL: 61.8 mg/dL (ref >39.00)
LDL Cholesterol: 166 mg/dL — ABNORMAL HIGH (ref 0–99)
NonHDL: 182.15
Total CHOL/HDL Ratio: 4
Triglycerides: 80 mg/dL (ref 0.0–149.0)
VLDL: 16 mg/dL (ref 0.0–40.0)

## 2020-12-08 LAB — COMPREHENSIVE METABOLIC PANEL WITH GFR
ALT: 18 U/L (ref 0–35)
AST: 14 U/L (ref 0–37)
Albumin: 4.5 g/dL (ref 3.5–5.2)
Alkaline Phosphatase: 68 U/L (ref 39–117)
BUN: 18 mg/dL (ref 6–23)
CO2: 29 meq/L (ref 19–32)
Calcium: 9.6 mg/dL (ref 8.4–10.5)
Chloride: 101 meq/L (ref 96–112)
Creatinine, Ser: 0.78 mg/dL (ref 0.40–1.20)
GFR: 88.09 mL/min (ref >60.00)
Glucose, Bld: 105 mg/dL — ABNORMAL HIGH (ref 70–99)
Potassium: 4 meq/L (ref 3.5–5.1)
Sodium: 139 meq/L (ref 135–145)
Total Bilirubin: 0.8 mg/dL (ref 0.2–1.2)
Total Protein: 8 g/dL (ref 6.0–8.3)

## 2020-12-08 LAB — HEMOGLOBIN A1C: Hgb A1c MFr Bld: 5.9 % (ref 4.6–6.5)

## 2020-12-08 MED ORDER — TOPIRAMATE 50 MG PO TABS: 50.0000 mg | ORAL_TABLET | Freq: Every day | ORAL | 0 refills | Status: DC

## 2020-12-08 MED ORDER — HYDROXYZINE HCL 10 MG PO TABS: 10.0000 mg | ORAL_TABLET | Freq: Two times a day (BID) | ORAL | 0 refills | Status: DC | PRN

## 2020-12-08 MED ORDER — FLUOXETINE HCL 20 MG PO TABS: ORAL_TABLET | ORAL | 3 refills | Status: DC

## 2020-12-08 NOTE — Assessment & Plan Note (Addendum)
No follow up since diagnosis over one year ago. A1C of 6.6 in February 2021.  Repeat A1C pending. Checking lipids and other labs.  Not currently on medications, continue off.

## 2020-12-08 NOTE — Progress Notes (Signed)
Subjective:    Patient ID: Susan Morales, female    DOB: Mar 20, 1971, 50 y.o.   MRN: [id number]  HPI  Susan Morales is a very pleasant 50 y.o. female who presents today for complete physical.  Chronic headaches since childhood, occurring to the shoulders up to the occipital lobes or frontal lobes. Headaches occur several days weekly, does have photophobia and phonophobia also nausea at times. Takes Ibuprofen or Tylenol several times weekly. She is adopted, is unsure of family history. She's never been treated for headaches in the past.   Immunizations: -Tetanus: Unsure, over 10 years  -Influenza: Completed last season  -Covid-19: Completed 3 vaccines -Shingles: Never completed   Diet: Fair diet.  Exercise: No regular exercise.  Eye exam: Completes annually  Dental exam: Completes semi-annually   Pap Smear: Hysterectomy Mammogram: May 2022 Colonoscopy:  Scheduled for August 2022      Review of Systems  Constitutional:  Negative for unexpected weight change.  HENT:  Negative for rhinorrhea.   Eyes:  Negative for visual disturbance.  Respiratory:  Negative for cough and shortness of breath.   Cardiovascular:  Negative for chest pain.  Gastrointestinal:  Negative for constipation and diarrhea.  Genitourinary:  Negative for difficulty urinating.  Musculoskeletal:  Positive for arthralgias and myalgias.  Skin:  Negative for rash.  Allergic/Immunologic: Negative for environmental allergies.  Neurological:  Positive for headaches. Negative for dizziness and numbness.  Psychiatric/Behavioral:  The patient is nervous/anxious.         Past Medical History:  Diagnosis Date   Adenomyosis 3/00/9233   Complication of anesthesia    Deviate septum, combative   Depression    GERD (gastroesophageal reflux disease)    Hemorrhoid    Pelvic pain 12/17/2018   UTI (urinary tract infection)     Social History   Socioeconomic History   Marital status: Married    Spouse name:  Not on file   Number of children: Not on file   Years of education: Not on file   Highest education level: Not on file  Occupational History   Not on file  Tobacco Use   Smoking status: Never   Smokeless tobacco: Never  Vaping Use   Vaping Use: Never used  Substance and Sexual Activity   Alcohol use: No    Alcohol/week: 0.0 standard drinks   Drug use: No   Sexual activity: Yes  Other Topics Concern   Not on file  Social History Narrative   Married.   4 children.   Works as a Print production planner.   Enjoys reading, spending time with her family.   Social Determinants of Health   Financial Resource Strain: Not on file  Food Insecurity: Not on file  Transportation Needs: Not on file  Physical Activity: Not on file  Stress: Not on file  Social Connections: Not on file  Intimate Partner Violence: Not on file    Past Surgical History:  Procedure Laterality Date   ABLATION  2015   DIRECT LARYNGOSCOPY N/A 01/29/2013   Procedure: DIRECT LARYNGOSCOPY, esophagoscopy, bronchoscopy, ;  Surgeon: Ruby Cola, MD;  Location: [facility];  Service: ENT;  Laterality: N/A;   LAPAROSCOPIC VAGINAL HYSTERECTOMY WITH SALPINGECTOMY Bilateral 12/17/2018   Procedure: LAPAROSCOPIC ASSISTED VAGINAL HYSTERECTOMY WITH SALPINGECTOMY;  Surgeon: Dian Queen, MD;  Location: [facility];  Service: Gynecology;  Laterality: Bilateral;    Family History  Adopted: Yes  Problem Relation Age of Onset   Breast cancer Mother  in 40's   Breast cancer Paternal Grandmother     No Known Allergies  Current Outpatient Medications on File Prior to Visit  Medication Sig Dispense Refill   cetirizine (ZYRTEC) 10 MG tablet Take 30 mg by mouth daily.      FLUoxetine (PROZAC) 20 MG tablet TAKE 2 TABLETS BY MOUTH EVERY DAY FOR ANXIETY 60 tablet 0   hydrOXYzine (ATARAX/VISTARIL) 10 MG tablet Take 1-2 tablets (10-20 mg total) by mouth 2 (two) times daily as needed for anxiety. 30 tablet 0    omeprazole (PRILOSEC) 10 MG capsule Take 10 mg by mouth daily.     No current facility-administered medications on file prior to visit.    BP 134/82   Pulse 89   Temp 97.6 F (36.4 C) (Temporal)   Ht 5\' 4"  (1.626 m)   Wt 239 lb (108.4 kg)   LMP 08/11/2018   SpO2 98%   BMI 41.02 kg/m  Objective:   Physical Exam HENT:     Right Ear: Tympanic membrane and ear canal normal.     Left Ear: Tympanic membrane and ear canal normal.     Nose: Nose normal.  Eyes:     Conjunctiva/sclera: Conjunctivae normal.     Pupils: Pupils are equal, round, and reactive to light.  Neck:     Thyroid: No thyromegaly.  Cardiovascular:     Rate and Rhythm: Normal rate and regular rhythm.     Heart sounds: No murmur heard. Pulmonary:     Effort: Pulmonary effort is normal.     Breath sounds: Normal breath sounds. No rales.  Abdominal:     General: Bowel sounds are normal.     Palpations: Abdomen is soft.     Tenderness: There is no abdominal tenderness.  Musculoskeletal:        General: Normal range of motion.     Cervical back: Neck supple.  Lymphadenopathy:     Cervical: No cervical adenopathy.  Skin:    General: Skin is warm and dry.     Findings: No rash.  Neurological:     Mental Status: She is alert and oriented to person, place, and time.     Cranial Nerves: No cranial nerve deficit.     Deep Tendon Reflexes: Reflexes are normal and symmetric.  Psychiatric:        Mood and Affect: Mood normal.          Assessment & Plan:      This visit occurred during the SARS-CoV-2 public health emergency.  Safety protocols were in place, including screening questions prior to the visit, additional usage of staff PPE, and extensive cleaning of exam room while observing appropriate contact time as indicated for disinfecting solutions.

## 2020-12-08 NOTE — Assessment & Plan Note (Signed)
Overall feels well managed on fluoxetine 40 mg, does have some bouts of anxiety. Discussed to use hydroxyzine 10 mg PRN, continue fluoxetine 40 mg daily.   Refills provided.

## 2020-12-08 NOTE — Assessment & Plan Note (Signed)
Shingrix and Tetanus due, provided today. Mammogram UTD. Colonoscopy is scheduled for August.  Discussed the importance of a healthy diet and regular exercise in order for weight loss, and to reduce the risk of further co-morbidity.  Exam today as noted.  Labs pending.

## 2020-12-08 NOTE — Assessment & Plan Note (Signed)
Overall doing well on omeprazole 10-20 mg daily, continue same.

## 2020-12-08 NOTE — Assessment & Plan Note (Signed)
Following with chiropractor and massage therapy. Continue current regimen.

## 2020-12-08 NOTE — Patient Instructions (Addendum)
Start topiramate (Topamax) 50 mg at bedtime for headache prevention.  Schedule a nurse visit for 2-6 months for your second shingles vaccine.  Stop by the lab prior to leaving today. I will notify you of your results once received.   Please update me in 2-3 weeks in regard to headaches.  It was a pleasure to see you today!

## 2020-12-08 NOTE — Assessment & Plan Note (Signed)
Chronic since childhood, no know FH of brain tumor/disorder. Frequent use of Tylenol and Ibuprofen.  Discussed options, will start daily preventative treatment. Consider imaging if no improvement. Rx for Topamax 50 mg HS sent to pharmacy, she will update in 2-3 weeks.

## 2020-12-09 LAB — HIV ANTIBODY (ROUTINE TESTING W REFLEX): HIV 1&2 Ab, 4th Generation: NONREACTIVE

## 2020-12-09 LAB — HEPATITIS C ANTIBODY
Hepatitis C Ab: NONREACTIVE
SIGNAL TO CUT-OFF: 0 (ref <1.00)

## 2020-12-18 NOTE — Addendum Note (Signed)
Addended by: Francella Solian on: 12/18/2020 03:45 PM   Modules accepted: Orders

## 2021-01-04 ENCOUNTER — Other Ambulatory Visit: Payer: Self-pay | Admitting: Primary Care

## 2021-01-04 DIAGNOSIS — R519 Headache, unspecified: Secondary | ICD-10-CM

## 2021-01-13 ENCOUNTER — Encounter: Payer: Managed Care, Other (non HMO) | Admitting: Primary Care

## 2021-01-14 ENCOUNTER — Other Ambulatory Visit: Payer: Self-pay

## 2021-01-14 ENCOUNTER — Ambulatory Visit (AMBULATORY_SURGERY_CENTER): Payer: Managed Care, Other (non HMO) | Admitting: *Deleted

## 2021-01-14 VITALS — Ht 64.0 in | Wt 239.0 lb

## 2021-01-14 DIAGNOSIS — Z1211 Encounter for screening for malignant neoplasm of colon: Secondary | ICD-10-CM

## 2021-01-14 NOTE — Progress Notes (Signed)
Patient's pre-visit was done today over the phone with the patient due to COVID-19 pandemic. Name,DOB and address verified. Insurance verified. Patient denies any allergies to Eggs and Soy. Patient denies any problems with anesthesia/sedation. Patient denies taking diet pills or blood thinners. No home Oxygen. Packet of Prep instructions mailed to patient including a copy of a consent form-pt is aware. Patient understands to call us back with any questions or concerns. Patient is aware of our care-partner policy and Covid-19 safety protocol.   EMMI education assigned to the patient for the procedure, sent to MyChart.   The patient is COVID-19 vaccinated, per patient.  

## 2021-02-01 ENCOUNTER — Telehealth: Payer: Self-pay | Admitting: Gastroenterology

## 2021-02-01 NOTE — Telephone Encounter (Signed)
Hey Dr. Tarri Glenn,   Patient called to cancel procedure for 03-05-2023 due to death in the family. She rescheduled for 8/19.  Thank you

## 2021-02-01 NOTE — Telephone Encounter (Signed)
Noted  

## 2021-02-03 ENCOUNTER — Encounter: Payer: Managed Care, Other (non HMO) | Admitting: Gastroenterology

## 2021-02-12 ENCOUNTER — Encounter: Payer: Self-pay | Admitting: Gastroenterology

## 2021-02-12 ENCOUNTER — Ambulatory Visit (AMBULATORY_SURGERY_CENTER): Payer: Managed Care, Other (non HMO) | Admitting: Gastroenterology

## 2021-02-12 ENCOUNTER — Other Ambulatory Visit: Payer: Self-pay

## 2021-02-12 VITALS — BP 142/82 | HR 70 | Temp 98.1°F | Resp 21 | Ht 64.0 in | Wt 239.0 lb

## 2021-02-12 DIAGNOSIS — K635 Polyp of colon: Secondary | ICD-10-CM | POA: Diagnosis not present

## 2021-02-12 DIAGNOSIS — Z1211 Encounter for screening for malignant neoplasm of colon: Secondary | ICD-10-CM | POA: Diagnosis not present

## 2021-02-12 DIAGNOSIS — D122 Benign neoplasm of ascending colon: Secondary | ICD-10-CM

## 2021-02-12 MED ORDER — SODIUM CHLORIDE 0.9 % IV SOLN: 500.0000 mL | Freq: Once | INTRAVENOUS | Status: DC

## 2021-02-12 NOTE — Progress Notes (Signed)
A and O x3. Report to RN. Tolerated MAC anesthesia well. 

## 2021-02-12 NOTE — Op Note (Signed)
[place] Patient Name: Susan Morales Procedure Date: 02/12/2021 1:44 PM MRN: [id number] Endoscopist: Thornton Park MD, MD Age: 50 Referring MD:  Date of Birth: [birth year] Gender: Female Account #: [id number] Procedure:                Colonoscopy Indications:              Screening for colorectal malignant neoplasm, This                            is the patient's first colonoscopy Medicines:                Monitored Anesthesia Care Procedure:                Pre-Anesthesia Assessment:                           - Prior to the procedure, a History and Physical                            was performed, and patient medications and                            allergies were reviewed. The patient's tolerance of                            previous anesthesia was also reviewed. The risks                            and benefits of the procedure and the sedation                            options and risks were discussed with the patient.                            All questions were answered, and informed consent                            was obtained. Prior Anticoagulants: The patient has                            taken no previous anticoagulant or antiplatelet                            agents. ASA Grade Assessment: II - A patient with                            mild systemic disease. After reviewing the risks                            and benefits, the patient was deemed in                            satisfactory condition to undergo the procedure.  After obtaining informed consent, the colonoscope                            was passed under direct vision. Throughout the                            procedure, the patient's blood pressure, pulse, and                            oxygen saturations were monitored continuously. The                            Olympus CF-HQ190L (Serial# [id number]) Colonoscope was                            introduced through the  anus and advanced to the 3                            cm into the ileum. A second forward view of the                            right colon was performed. The colonoscopy was                            performed without difficulty. The patient tolerated                            the procedure well. The quality of the bowel                            preparation was good. The terminal ileum, ileocecal                            valve, appendiceal orifice, and rectum were                            photographed. Scope In: 1:50:57 PM Scope Out: 2:04:26 PM Scope Withdrawal Time: 0 hours 11 minutes 9 seconds  Total Procedure Duration: 0 hours 13 minutes 29 seconds  Findings:                 The perianal and digital rectal examinations were                            normal.                           A 7 mm polyp was found in the ascending colon. The                            polyp was flat. The polyp was removed with a cold                            snare. Resection and retrieval were complete.  Estimated blood loss was minimal.                           The exam was otherwise without abnormality on                            direct and retroflexion views. Complications:            No immediate complications. Estimated blood loss:                            Minimal. Estimated Blood Loss:     Estimated blood loss was minimal. Impression:               - One 7 mm polyp in the ascending colon, removed                            with a cold snare. Resected and retrieved.                           - The examination was otherwise normal on direct                            and retroflexion views. Recommendation:           - Patient has a contact number available for                            emergencies. The signs and symptoms of potential                            delayed complications were discussed with the                            patient. Return to normal  activities tomorrow.                            Written discharge instructions were provided to the                            patient.                           - Resume previous diet.                           - Continue present medications.                           - Await pathology results.                           - Repeat colonoscopy date to be determined after                            pending pathology results are reviewed for  surveillance.                           - Emerging evidence supports eating a diet of                            fruits, vegetables, grains, calcium, and yogurt                            while reducing red meat and alcohol may reduce the                            risk of colon cancer.                           - Thank you for allowing me to be involved in your                            colon cancer prevention. Thornton Park MD, MD 02/12/2021 2:08:15 PM This report has been signed electronically.

## 2021-02-12 NOTE — Progress Notes (Signed)
   Referring Provider: Kerry Dory, NP Primary Care Physician:  Pleas Koch, NP  Reason for Consultation:  Colon cancer screening   IMPRESSION:  Need for colon cancer screening  PLAN: Colonoscopy in the [facility] today   HPI: Susan Morales is a 50 y.o. female presents for screening colonoscopy.  No prior colonoscopy or colon cancer screening.  No baseline GI symptoms.   Prior EGD with Dr. Olevia Perches in 1992 and 2006 for reflux esophagitis and a hiatal hernia.   Adopted. Family history unknown.   Past Medical History:  Diagnosis Date   Adenomyosis 12/17/2018   Allergy    Complication of anesthesia    Deviate septum, combative   Depression    GERD (gastroesophageal reflux disease)    Hemorrhoid    Pelvic pain 12/17/2018   UTI (urinary tract infection)     Past Surgical History:  Procedure Laterality Date   ABLATION  2015   DIRECT LARYNGOSCOPY N/A 01/29/2013   Procedure: DIRECT LARYNGOSCOPY, esophagoscopy, bronchoscopy, ;  Surgeon: Ruby Cola, MD;  Location: [facility];  Service: ENT;  Laterality: N/A;   LAPAROSCOPIC VAGINAL HYSTERECTOMY WITH SALPINGECTOMY Bilateral 12/17/2018   Procedure: LAPAROSCOPIC ASSISTED VAGINAL HYSTERECTOMY WITH SALPINGECTOMY;  Surgeon: Dian Queen, MD;  Location: [facility];  Service: Gynecology;  Laterality: Bilateral;    Current Outpatient Medications  Medication Sig Dispense Refill   APPLE CIDER VINEGAR PO Take by mouth.     cetirizine (ZYRTEC) 10 MG tablet Take 30 mg by mouth daily.      ESTERIFIED ESTROGENS PO Take by mouth.     FLUoxetine (PROZAC) 20 MG tablet TAKE 2 TABLETS BY MOUTH once daily FOR ANXIETY 180 tablet 3   fluticasone (FLONASE) 50 MCG/ACT nasal spray Place into both nostrils daily.     hydrOXYzine (ATARAX/VISTARIL) 10 MG tablet Take 1-2 tablets (10-20 mg total) by mouth 2 (two) times daily as needed for anxiety. 30 tablet 0   omeprazole (PRILOSEC) 10 MG capsule Take 10 mg by mouth daily.      Probiotic Product (PROBIOTIC DAILY PO) Take by mouth.     No current facility-administered medications for this visit.    Allergies as of 02/12/2021   (No Known Allergies)    Family History  Adopted: Yes  Problem Relation Age of Onset   Breast cancer Mother        in 75's   Breast cancer Paternal Grandmother    Colon cancer Neg Hx      Physical Exam: General:   Alert,  well-nourished, pleasant and cooperative in NAD Head:  Normocephalic and atraumatic. Eyes:  Sclera clear, no icterus.   Conjunctiva pink. Mouth:  No deformity or lesions.   Neck:  Supple; no masses or thyromegaly. Lungs:  Clear throughout to auscultation.   No wheezes. Heart:  Regular rate and rhythm; no murmurs. Abdomen:  Soft, non-tender, nondistended, normal bowel sounds, no rebound or guarding.  Msk:  Symmetrical. No boney deformities LAD: No inguinal or umbilical LAD Extremities:  No clubbing or edema. Neurologic:  Alert and  oriented x4;  grossly nonfocal Skin:  No obvious rash or bruise. Psych:  Alert and cooperative. Normal mood and affect.      Tabitha Tupper L. Tarri Glenn, MD, MPH 02/12/2021, 1:14 PM

## 2021-02-12 NOTE — Progress Notes (Signed)
VS- Susan Morales  Pt states she became combative with ablation- did not have any issues with hysterectomy  Pt's states no medical or surgical changes since previsit or office visit.

## 2021-02-12 NOTE — Patient Instructions (Signed)
Handout on polyps given to you today  YOU HAD AN ENDOSCOPIC PROCEDURE TODAY AT [facility]:   Refer to the procedure report that was given to you for any specific questions about what was found during the examination.  If the procedure report does not answer your questions, please call your gastroenterologist to clarify.  If you requested that your care partner not be given the details of your procedure findings, then the procedure report has been included in a sealed envelope for you to review at your convenience later.  YOU SHOULD EXPECT: Some feelings of bloating in the abdomen. Passage of more gas than usual.  Walking can help get rid of the air that was put into your GI tract during the procedure and reduce the bloating. If you had a lower endoscopy (such as a colonoscopy or flexible sigmoidoscopy) you may notice spotting of blood in your stool or on the toilet paper. If you underwent a bowel prep for your procedure, you may not have a normal bowel movement for a few days.  Please Note:  You might notice some irritation and congestion in your nose or some drainage.  This is from the oxygen used during your procedure.  There is no need for concern and it should clear up in a day or so.  SYMPTOMS TO REPORT IMMEDIATELY:  Following lower endoscopy (colonoscopy or flexible sigmoidoscopy):  Excessive amounts of blood in the stool  Significant tenderness or worsening of abdominal pains  Swelling of the abdomen that is new, acute  Fever of 100F or higher  For urgent or emergent issues, a gastroenterologist can be reached at any hour by calling [phone]. Do not use MyChart messaging for urgent concerns.    DIET:  We do recommend a small meal at first, but then you may proceed to your regular diet.  Drink plenty of fluids but you should avoid alcoholic beverages for 24 hours.  ACTIVITY:  You should plan to take it easy for the rest of today and you should NOT DRIVE or use  heavy machinery until tomorrow (because of the sedation medicines used during the test).    FOLLOW UP: Our staff will call the number listed on your records 48-72 hours following your procedure to check on you and address any questions or concerns that you may have regarding the information given to you following your procedure. If we do not reach you, we will leave a message.  We will attempt to reach you two times.  During this call, we will ask if you have developed any symptoms of COVID 19. If you develop any symptoms (ie: fever, flu-like symptoms, shortness of breath, cough etc.) before then, please call [phone].  If you test positive for Covid 19 in the 2 weeks post procedure, please call and report this information to [place].    If any biopsies were taken you will be contacted by phone or by letter within the next 1-3 weeks.  Please call us at [phone] if you have not heard about the biopsies in 3 weeks.    SIGNATURES/CONFIDENTIALITY: You and/or your care partner have signed paperwork which will be entered into your electronic medical record.  These signatures attest to the fact that that the information above on your After Visit Summary has been reviewed and is understood.  Full responsibility of the confidentiality of this discharge information lies with you and/or your care-partner.

## 2021-02-12 NOTE — Progress Notes (Signed)
Called to room to assist during endoscopic procedure.  Patient ID and intended procedure confirmed with present staff. Received instructions for my participation in the procedure from the performing physician.  

## 2021-02-16 ENCOUNTER — Telehealth: Payer: Self-pay

## 2021-02-16 NOTE — Telephone Encounter (Signed)
  Follow up Call-  Call back number 02/12/2021  Post procedure Call Back phone  # [phone]  Permission to leave phone message Yes  Some recent data might be hidden     Patient questions:  Do you have a fever, pain , or abdominal swelling? No. Pain Score  0 *  Have you tolerated food without any problems? Yes.    Have you been able to return to your normal activities? Yes.    Do you have any questions about your discharge instructions: Diet   No. Medications  No. Follow up visit  No.  Do you have questions or concerns about your Care? No.  Actions: * If pain score is 4 or above: No action needed, pain <4.  Have you developed a fever since your procedure? no  2.   Have you had an respiratory symptoms (SOB or cough) since your procedure? no  3.   Have you tested positive for COVID 19 since your procedure no  4.   Have you had any family members/close contacts diagnosed with the COVID 19 since your procedure?  no   If yes to any of these questions please route to Joylene John, RN and Joella Prince, RN

## 2021-02-17 ENCOUNTER — Encounter: Payer: Self-pay | Admitting: Gastroenterology

## 2021-03-11 ENCOUNTER — Telehealth: Payer: Self-pay

## 2021-03-11 ENCOUNTER — Ambulatory Visit: Payer: Managed Care, Other (non HMO)

## 2021-03-11 NOTE — Telephone Encounter (Signed)
[facility] Night - Client Nonclinical Telephone Record [facility] Client [facility] Night - Client Client Site [place] Physician Alma Friendly - NP Contact Type Call Who Is Calling Patient / Member / Family / Caregiver Caller Name [place] Phone Number [phone] Patient Name Susan Morales Patient DOB June 09, 1971 Call Type Message Only Information Provided Reason for Call Request to Reschedule Office Appointment Initial Comment Caller states she wants to reschedule her 845am appointment for bloodwork and shingles vaccination. She woke up with a bad migraine. Patient request to speak to RN No Additional Comment Please call as soon as possible. Disp. Time Disposition Final User 03/11/2021 7:27:24 AM General Information Provided Yes Clydene Laming, Amy Call Closed By: [place] [facility] Transaction Date/Time: 03/11/2021 7:24:53 AM (ET)

## 2021-03-11 NOTE — Telephone Encounter (Signed)
Called pt to reschedule appt. Lvm to call back

## 2021-03-12 NOTE — Telephone Encounter (Signed)
LMTCB to reschedule her appt

## 2021-07-09 ENCOUNTER — Other Ambulatory Visit: Payer: Self-pay | Admitting: Nurse Practitioner

## 2021-07-09 DIAGNOSIS — Z803 Family history of malignant neoplasm of breast: Secondary | ICD-10-CM

## 2021-07-22 ENCOUNTER — Other Ambulatory Visit: Payer: Self-pay

## 2021-07-22 ENCOUNTER — Ambulatory Visit
Admission: RE | Admit: 2021-07-22 | Discharge: 2021-07-22 | Disposition: A | Discharge status: Home / Self Care | Payer: Managed Care, Other (non HMO) | Source: Ambulatory Visit | Attending: Nurse Practitioner | Admitting: Nurse Practitioner

## 2021-07-22 DIAGNOSIS — Z803 Family history of malignant neoplasm of breast: Secondary | ICD-10-CM

## 2021-07-22 MED ORDER — GADOBUTROL 1 MMOL/ML IV SOLN
10.0000 mL | Freq: Once | INTRAVENOUS | Status: AC | PRN
  Administered 2021-07-22: 10 mL via INTRAVENOUS

## 2021-07-28 LAB — HM PAP SMEAR
HM Pap smear: NEGATIVE
HPV, high-risk: NEGATIVE

## 2021-09-16 LAB — HM DEXA SCAN: HM Dexa Scan: NORMAL

## 2021-12-14 ENCOUNTER — Other Ambulatory Visit: Payer: Self-pay | Admitting: Obstetrics and Gynecology

## 2021-12-14 DIAGNOSIS — Z1231 Encounter for screening mammogram for malignant neoplasm of breast: Secondary | ICD-10-CM

## 2021-12-15 ENCOUNTER — Ambulatory Visit
Admission: RE | Admit: 2021-12-15 | Discharge: 2021-12-15 | Disposition: A | Discharge status: Home / Self Care | Payer: Managed Care, Other (non HMO) | Source: Ambulatory Visit | Attending: Obstetrics and Gynecology | Admitting: Obstetrics and Gynecology

## 2021-12-15 DIAGNOSIS — Z1231 Encounter for screening mammogram for malignant neoplasm of breast: Secondary | ICD-10-CM

## 2021-12-25 ENCOUNTER — Other Ambulatory Visit: Payer: Self-pay | Admitting: Primary Care

## 2021-12-25 DIAGNOSIS — F419 Anxiety disorder, unspecified: Secondary | ICD-10-CM

## 2022-01-07 ENCOUNTER — Ambulatory Visit (INDEPENDENT_AMBULATORY_CARE_PROVIDER_SITE_OTHER): Payer: Managed Care, Other (non HMO) | Admitting: Primary Care

## 2022-01-07 ENCOUNTER — Encounter: Payer: Self-pay | Admitting: Primary Care

## 2022-01-07 VITALS — BP 130/78 | HR 87 | Temp 98.0°F | Ht 64.0 in

## 2022-01-07 DIAGNOSIS — R519 Headache, unspecified: Secondary | ICD-10-CM

## 2022-01-07 DIAGNOSIS — K219 Gastro-esophageal reflux disease without esophagitis: Secondary | ICD-10-CM | POA: Diagnosis not present

## 2022-01-07 DIAGNOSIS — F419 Anxiety disorder, unspecified: Secondary | ICD-10-CM | POA: Diagnosis not present

## 2022-01-07 DIAGNOSIS — R0683 Snoring: Secondary | ICD-10-CM | POA: Diagnosis not present

## 2022-01-07 DIAGNOSIS — E1165 Type 2 diabetes mellitus with hyperglycemia: Secondary | ICD-10-CM

## 2022-01-07 DIAGNOSIS — F32A Depression, unspecified: Secondary | ICD-10-CM

## 2022-01-07 DIAGNOSIS — Z0001 Encounter for general adult medical examination with abnormal findings: Secondary | ICD-10-CM

## 2022-01-07 DIAGNOSIS — M797 Fibromyalgia: Secondary | ICD-10-CM

## 2022-01-07 LAB — COMPREHENSIVE METABOLIC PANEL
ALT: 23 U/L (ref 0–35)
AST: 17 U/L (ref 0–37)
Albumin: 4.3 g/dL (ref 3.5–5.2)
Alkaline Phosphatase: 62 U/L (ref 39–117)
BUN: 17 mg/dL (ref 6–23)
CO2: 29 mEq/L (ref 19–32)
Calcium: 9.3 mg/dL (ref 8.4–10.5)
Chloride: 101 mEq/L (ref 96–112)
Creatinine, Ser: 0.69 mg/dL (ref 0.40–1.20)
GFR: 99.89 mL/min (ref >60.00)
Glucose, Bld: 97 mg/dL (ref 70–99)
Potassium: 4 mEq/L (ref 3.5–5.1)
Sodium: 137 mEq/L (ref 135–145)
Total Bilirubin: 0.8 mg/dL (ref 0.2–1.2)
Total Protein: 7.6 g/dL (ref 6.0–8.3)

## 2022-01-07 LAB — LIPID PANEL
Cholesterol: 277 mg/dL — ABNORMAL HIGH (ref 0–200)
HDL: 65.1 mg/dL (ref >39.00)
LDL Cholesterol: 190 mg/dL — ABNORMAL HIGH (ref 0–99)
NonHDL: 211.85
Total CHOL/HDL Ratio: 4
Triglycerides: 109 mg/dL (ref 0.0–149.0)
VLDL: 21.8 mg/dL (ref 0.0–40.0)

## 2022-01-07 LAB — HEMOGLOBIN A1C: Hgb A1c MFr Bld: 5.9 % (ref 4.6–6.5)

## 2022-01-07 NOTE — Assessment & Plan Note (Signed)
Symptoms suspicious for sleep apnea. Referral placed to pulmonology for further evaluation.

## 2022-01-07 NOTE — Assessment & Plan Note (Addendum)
Controlled.  Continue fluoxetine 20 mg daily.  Continue hydroxyzine 10 mg PRN Continue to monitor.

## 2022-01-07 NOTE — Assessment & Plan Note (Signed)
Repeat A1C pending.  Discussed the importance of a healthy diet and regular exercise in order for weight loss, and to reduce the risk of further co-morbidity.  

## 2022-01-07 NOTE — Assessment & Plan Note (Signed)
Immunizations UTD. Colonoscopy UTD, due 2027. Mammogram UTD  Discussed the importance of a healthy diet and regular exercise in order for weight loss, and to reduce the risk of further co-morbidity.  Exam stable. Labs pending.  Follow up in 1 year for repeat physical.

## 2022-01-07 NOTE — Assessment & Plan Note (Signed)
Overall stable. Continue to monitor.  

## 2022-01-07 NOTE — Assessment & Plan Note (Signed)
Controlled.  Never took Topamax 50 mg. Continue to monitor.

## 2022-01-07 NOTE — Patient Instructions (Signed)
Stop by the lab prior to leaving today. I will notify you of your results once received.   You will be contacted regarding your referral to pulmonology for the sleep study and for lung function testing.  Please let us know if you have not been contacted within two weeks.   It was a pleasure to see you today!  Preventive Care 72-52 Years Old, Female Preventive care refers to lifestyle choices and visits with your health care provider that can promote health and wellness. Preventive care visits are also called wellness exams. What can I expect for my preventive care visit? Counseling Your health care provider may ask you questions about your: Medical history, including: Past medical problems. Family medical history. Pregnancy history. Current health, including: Menstrual cycle. Method of birth control. Emotional well-being. Home life and relationship well-being. Sexual activity and sexual health. Lifestyle, including: Alcohol, nicotine or tobacco, and drug use. Access to firearms. Diet, exercise, and sleep habits. Work and work Statistician. Sunscreen use. Safety issues such as seatbelt and bike helmet use. Physical exam Your health care provider will check your: Height and weight. These may be used to calculate your BMI (body mass index). BMI is a measurement that tells if you are at a healthy weight. Waist circumference. This measures the distance around your waistline. This measurement also tells if you are at a healthy weight and may help predict your risk of certain diseases, such as type 2 diabetes and high blood pressure. Heart rate and blood pressure. Body temperature. Skin for abnormal spots. What immunizations do I need?  Vaccines are usually given at various ages, according to a schedule. Your health care provider will recommend vaccines for you based on your age, medical history, and lifestyle or other factors, such as travel or where you work. What tests do I  need? Screening Your health care provider may recommend screening tests for certain conditions. This may include: Lipid and cholesterol levels. Diabetes screening. This is done by checking your blood sugar (glucose) after you have not eaten for a while (fasting). Pelvic exam and Pap test. Hepatitis B test. Hepatitis C test. HIV (human immunodeficiency virus) test. STI (sexually transmitted infection) testing, if you are at risk. Lung cancer screening. Colorectal cancer screening. Mammogram. Talk with your health care provider about when you should start having regular mammograms. This may depend on whether you have a family history of breast cancer. BRCA-related cancer screening. This may be done if you have a family history of breast, ovarian, tubal, or peritoneal cancers. Bone density scan. This is done to screen for osteoporosis. Talk with your health care provider about your test results, treatment options, and if necessary, the need for more tests. Follow these instructions at home: Eating and drinking  Eat a diet that includes fresh fruits and vegetables, whole grains, lean protein, and low-fat dairy products. Take vitamin and mineral supplements as recommended by your health care provider. Do not drink alcohol if: Your health care provider tells you not to drink. You are pregnant, may be pregnant, or are planning to become pregnant. If you drink alcohol: Limit how much you have to 0-1 drink a day. Know how much alcohol is in your drink. In the [place] one drink equals one 12 oz bottle of beer (355 mL), one 5 oz glass of wine (148 mL), or one 1 oz glass of hard liquor (44 mL). Lifestyle Brush your teeth every morning and night with fluoride toothpaste. Floss one time each day. Exercise for at  least 30 minutes 5 or more days each week. Do not use any products that contain nicotine or tobacco. These products include cigarettes, chewing tobacco, and vaping devices, such as  e-cigarettes. If you need help quitting, ask your health care provider. Do not use drugs. If you are sexually active, practice safe sex. Use a condom or other form of protection to prevent STIs. If you do not wish to become pregnant, use a form of birth control. If you plan to become pregnant, see your health care provider for a prepregnancy visit. Take aspirin only as told by your health care provider. Make sure that you understand how much to take and what form to take. Work with your health care provider to find out whether it is safe and beneficial for you to take aspirin daily. Find healthy ways to manage stress, such as: Meditation, yoga, or listening to music. Journaling. Talking to a trusted person. Spending time with friends and family. Minimize exposure to UV radiation to reduce your risk of skin cancer. Safety Always wear your seat belt while driving or riding in a vehicle. Do not drive: If you have been drinking alcohol. Do not ride with someone who has been drinking. When you are tired or distracted. While texting. If you have been using any mind-altering substances or drugs. Wear a helmet and other protective equipment during sports activities. If you have firearms in your house, make sure you follow all gun safety procedures. Seek help if you have been physically or sexually abused. What's next? Visit your health care provider once a year for an annual wellness visit. Ask your health care provider how often you should have your eyes and teeth checked. Stay up to date on all vaccines. This information is not intended to replace advice given to you by your health care provider. Make sure you discuss any questions you have with your health care provider. Document Revised: 12/09/2020 Document Reviewed: 12/09/2020 Elsevier Patient Education  [place].

## 2022-01-07 NOTE — Assessment & Plan Note (Signed)
Controlled.   Continue omeprazole 20 mg daily. 

## 2022-01-07 NOTE — Progress Notes (Signed)
Subjective:    Patient ID: Susan Morales, female    DOB: 02-Apr-1971, 51 y.o.   MRN: [id number]  HPI  PHILIP ECKERSLEY is a very pleasant 51 y.o. female who presents today for complete physical and follow up of chronic conditions.  She would also like to discuss snoring, daytime fatigue. She's been snoring for the last 3 years, also with intermittent daytime tiredness. Is concerned to learn if she has sleep apnea. She's also concerned that she may have asthma. She has noticed chest tightness and persistent cough each time she gets a respiratory infection. She is prescribed an albuterol inhaler and prednisone and notices quick relief. She denies a childhood history of asthma. She has never smoked.   Immunizations: -Tetanus: 2022 -Influenza: Does not completed  -Covid-19: 3 vaccines  -Shingles: Completed 1 dose of Shingrix, declines second dose  Diet: Fair diet.  Exercise: No regular exercise.  Eye exam: Completes annually  Dental exam: Completes semi-annually   Pap Smear: Hysterectomy  Mammogram: Completed in June 2023  Colonoscopy: Completed in 2022, due 2027  BP Readings from Last 3 Encounters:  01/07/22 130/78  02/12/21 (!) 142/82  12/08/20 134/82        Review of Systems  Constitutional:  Negative for unexpected weight change.  HENT:  Negative for rhinorrhea.   Respiratory:  Negative for cough and shortness of breath.        Snoring, see HPI  Cardiovascular:  Negative for chest pain.  Gastrointestinal:  Negative for constipation and diarrhea.  Genitourinary:  Negative for difficulty urinating.  Musculoskeletal:  Negative for arthralgias and myalgias.  Skin:  Negative for rash.  Allergic/Immunologic: Positive for environmental allergies.  Neurological:  Negative for dizziness and headaches.  Psychiatric/Behavioral:  The patient is not nervous/anxious.          Past Medical History:  Diagnosis Date   Adenomyosis 12/17/2018   Allergy    Complication of  anesthesia    Deviate septum, combative   COVID-19 virus infection 03/06/2020   Depression    GERD (gastroesophageal reflux disease)    Hemorrhoid    Pelvic pain 12/17/2018   UTI (urinary tract infection)     Social History   Socioeconomic History   Marital status: Married    Spouse name: Not on file   Number of children: Not on file   Years of education: Not on file   Highest education level: Not on file  Occupational History   Not on file  Tobacco Use   Smoking status: Never   Smokeless tobacco: Never  Vaping Use   Vaping Use: Never used  Substance and Sexual Activity   Alcohol use: No    Alcohol/week: 0.0 standard drinks of alcohol   Drug use: No   Sexual activity: Yes  Other Topics Concern   Not on file  Social History Narrative   Married.   4 children.   Works as a Print production planner.   Enjoys reading, spending time with her family.   Social Determinants of Health   Financial Resource Strain: Not on file  Food Insecurity: Not on file  Transportation Needs: Not on file  Physical Activity: Not on file  Stress: Not on file  Social Connections: Not on file  Intimate Partner Violence: Not on file    Past Surgical History:  Procedure Laterality Date   ABLATION  2015   DIRECT LARYNGOSCOPY N/A 01/29/2013   Procedure: DIRECT LARYNGOSCOPY, esophagoscopy, bronchoscopy, ;  Surgeon: Ruby Cola, MD;  Location:  [facility];  Service: ENT;  Laterality: N/A;   LAPAROSCOPIC VAGINAL HYSTERECTOMY WITH SALPINGECTOMY Bilateral 12/17/2018   Procedure: LAPAROSCOPIC ASSISTED VAGINAL HYSTERECTOMY WITH SALPINGECTOMY;  Surgeon: Dian Queen, MD;  Location: [place];  Service: Gynecology;  Laterality: Bilateral;   UPPER GASTROINTESTINAL ENDOSCOPY      Family History  Adopted: Yes  Problem Relation Age of Onset   Breast cancer Mother        in 32's   Breast cancer Paternal Grandmother    Colon cancer Neg Hx     No Known Allergies  Current Outpatient  Medications on File Prior to Visit  Medication Sig Dispense Refill   APPLE CIDER VINEGAR PO Take by mouth.     cetirizine (ZYRTEC) 10 MG tablet Take 30 mg by mouth daily.      ESTERIFIED ESTROGENS PO Take by mouth.     FLUoxetine (PROZAC) 20 MG tablet TAKE 2 TABLETS BY MOUTH once daily FOR ANXIETY. Office visit required for further refills. 60 tablet 0   hydrOXYzine (ATARAX/VISTARIL) 10 MG tablet Take 1-2 tablets (10-20 mg total) by mouth 2 (two) times daily as needed for anxiety. 30 tablet 0   omeprazole (PRILOSEC) 10 MG capsule Take 10 mg by mouth daily.     Probiotic Product (PROBIOTIC DAILY PO) Take by mouth.     No current facility-administered medications on file prior to visit.    BP 130/78   Pulse 87   Temp 98 F (36.7 C) (Oral)   Ht '5\' 4"'$  (1.626 m)   LMP 08/11/2018   SpO2 97%   BMI 41.02 kg/m  Objective:   Physical Exam HENT:     Right Ear: Tympanic membrane and ear canal normal.     Left Ear: Tympanic membrane and ear canal normal.     Nose: Nose normal.  Eyes:     Conjunctiva/sclera: Conjunctivae normal.     Pupils: Pupils are equal, round, and reactive to light.  Neck:     Thyroid: No thyromegaly.  Cardiovascular:     Rate and Rhythm: Normal rate and regular rhythm.     Heart sounds: No murmur heard. Pulmonary:     Effort: Pulmonary effort is normal.     Breath sounds: Normal breath sounds. No rales.  Abdominal:     General: Bowel sounds are normal.     Palpations: Abdomen is soft.     Tenderness: There is no abdominal tenderness.  Musculoskeletal:        General: Normal range of motion.     Cervical back: Neck supple.  Lymphadenopathy:     Cervical: No cervical adenopathy.  Skin:    General: Skin is warm and dry.     Findings: No rash.  Neurological:     Mental Status: She is alert and oriented to person, place, and time.     Cranial Nerves: No cranial nerve deficit.     Deep Tendon Reflexes: Reflexes are normal and symmetric.  Psychiatric:         Mood and Affect: Mood normal.           Assessment & Plan:   Problem List Items Addressed This Visit       Digestive   GERD (gastroesophageal reflux disease)    Controlled.  Continue omeprazole 20 mg daily.         Endocrine   Controlled type 2 diabetes mellitus ([place])    Repeat A1C pending.  Discussed the importance of a healthy diet and regular  exercise in order for weight loss, and to reduce the risk of further co-morbidity.       Relevant Orders   Lipid panel   Hemoglobin A1c   Comprehensive metabolic panel     Other   Anxiety and depression    Controlled.  Continue fluoxetine 20 mg daily.  Continue hydroxyzine 10 mg PRN Continue to monitor.       Fibromyalgia    Overall stable.  Continue to monitor.       Encounter for annual general medical examination with abnormal findings in adult - Primary    Immunizations UTD. Colonoscopy UTD, due 2027. Mammogram UTD  Discussed the importance of a healthy diet and regular exercise in order for weight loss, and to reduce the risk of further co-morbidity.  Exam stable. Labs pending.  Follow up in 1 year for repeat physical.       Frequent headaches    Controlled.  Never took Topamax 50 mg. Continue to monitor.       Snoring    Symptoms suspicious for sleep apnea. Referral placed to pulmonology for further evaluation.      Relevant Orders   Ambulatory referral to Pulmonology       Pleas Koch, NP

## 2022-01-27 ENCOUNTER — Other Ambulatory Visit: Payer: Self-pay | Admitting: Primary Care

## 2022-01-27 DIAGNOSIS — F419 Anxiety disorder, unspecified: Secondary | ICD-10-CM

## 2022-04-27 ENCOUNTER — Ambulatory Visit (INDEPENDENT_AMBULATORY_CARE_PROVIDER_SITE_OTHER): Payer: Managed Care, Other (non HMO) | Admitting: Pulmonary Disease

## 2022-04-27 ENCOUNTER — Encounter: Payer: Self-pay | Admitting: Pulmonary Disease

## 2022-04-27 VITALS — BP 116/78 | HR 99 | Temp 98.2°F | Ht 64.0 in | Wt 276.0 lb

## 2022-04-27 DIAGNOSIS — R0683 Snoring: Secondary | ICD-10-CM

## 2022-04-27 DIAGNOSIS — R0609 Other forms of dyspnea: Secondary | ICD-10-CM | POA: Diagnosis not present

## 2022-04-27 MED ORDER — AMOXICILLIN-POT CLAVULANATE 875-125 MG PO TABS: 1.0000 | ORAL_TABLET | Freq: Two times a day (BID) | ORAL | 0 refills | Status: AC

## 2022-04-27 MED ORDER — PREDNISONE 20 MG PO TABS: 20.0000 mg | ORAL_TABLET | Freq: Every day | ORAL | 0 refills | Status: DC

## 2022-04-27 NOTE — Patient Instructions (Signed)
We will schedule you for home sleep study to assess presence of significant sleep disordered breathing  Breathing study will be scheduled  Prescription for prednisone to be used for as few days as possible once symptoms start to improve you can stop  Course of antibiotics  Continue albuterol as needed  Tentative follow-up in about 3 months

## 2022-04-27 NOTE — Progress Notes (Signed)
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 A work mate recently had strep throat  History of snoring No witnessed apneas She does have dryness of the mouth in the mornings, morning headaches No night sweats Memory is good Usually goes to bed about 9 PM, takes about 30 minutes to fall asleep Final wake up time by 5:50 AM Up to 4 awakenings sometimes  She has always had some chest tightness chest discomfort with any kind of respiratory infection  Not sure whether she has ever been told about asthma  Never smoker  Weight has been up and down  Outpatient Encounter Medications as of 04/27/2022  Medication Sig   albuterol (VENTOLIN HFA) 108 (90 Base) MCG/ACT inhaler Inhale 1-2 puffs into the lungs every 6 (six) hours as needed for wheezing or shortness of breath.   APPLE CIDER VINEGAR PO Take by mouth.   cetirizine (ZYRTEC) 10 MG tablet Take 30 mg by mouth daily.    ESTERIFIED ESTROGENS PO Take by mouth.   FLUoxetine (PROZAC) 20 MG tablet TAKE 2 TABLETS BY MOUTH once daily FOR ANXIETY.   hydrOXYzine (ATARAX/VISTARIL) 10 MG tablet Take 1-2 tablets (10-20 mg total) by mouth 2 (two) times daily as needed for anxiety.   omeprazole (PRILOSEC) 10 MG capsule Take 10 mg by mouth daily.   Probiotic Product (PROBIOTIC DAILY PO) Take by mouth.   No facility-administered encounter medications on file as of 04/27/2022.    Allergies as of 04/27/2022   (No Known Allergies)    Past Medical History:  Diagnosis Date   Adenomyosis 12/17/2018   Allergy    Complication of anesthesia    Deviate septum, combative   COVID-19 virus infection 03/06/2020   Depression     GERD (gastroesophageal reflux disease)    Hemorrhoid    Pelvic pain 12/17/2018   UTI (urinary tract infection)     Past Surgical History:  Procedure Laterality Date   ABLATION  2015   DIRECT LARYNGOSCOPY N/A 01/29/2013   Procedure: DIRECT LARYNGOSCOPY, esophagoscopy, bronchoscopy, ;  Surgeon: Ruby Cola, MD;  Location: [facility];  Service: ENT;  Laterality: N/A;   LAPAROSCOPIC VAGINAL HYSTERECTOMY WITH SALPINGECTOMY Bilateral 12/17/2018   Procedure: LAPAROSCOPIC ASSISTED VAGINAL HYSTERECTOMY WITH SALPINGECTOMY;  Surgeon: Dian Queen, MD;  Location: [place];  Service: Gynecology;  Laterality: Bilateral;   UPPER GASTROINTESTINAL ENDOSCOPY      Family History  Adopted: Yes  Problem Relation Age of Onset   Breast cancer Mother        in 56's   Breast cancer Paternal Grandmother    Colon cancer Neg Hx     Social History   Socioeconomic History   Marital status: Married    Spouse name: Not on file   Number of children: Not on file   Years of education: Not on file   Highest education level: Not on file  Occupational History   Not on file  Tobacco Use   Smoking status: Never   Smokeless tobacco: Never  Vaping Use   Vaping Use: Never used  Substance and Sexual Activity   Alcohol use: No    Alcohol/week:  0.0 standard drinks of alcohol   Drug use: No   Sexual activity: Yes  Other Topics Concern   Not on file  Social History Narrative   Married.   4 children.   Works as a Print production planner.   Enjoys reading, spending time with her family.   Social Determinants of Health   Financial Resource Strain: Not on file  Food Insecurity: Not on file  Transportation Needs: Not on file  Physical Activity: Not on file  Stress: Not on file  Social Connections: Not on file  Intimate Partner Violence: Not on file    Review of Systems  Constitutional:  Positive for fatigue.  Psychiatric/Behavioral:  Positive for sleep disturbance.     Vitals:    04/27/22 1504  BP: 116/78  Pulse: 99  Temp: 98.2 F (36.8 C)  SpO2: 99%     Physical Exam Constitutional:      Appearance: She is obese.  HENT:     Head: Normocephalic.     Mouth/Throat:     Mouth: Mucous membranes are moist.  Cardiovascular:     Rate and Rhythm: Normal rate and regular rhythm.     Heart sounds: No murmur heard.    No friction rub.  Pulmonary:     Effort: No respiratory distress.     Breath sounds: No stridor. No wheezing or rhonchi.  Musculoskeletal:     Cervical back: No rigidity or tenderness.  Neurological:     Mental Status: She is alert.  Psychiatric:        Mood and Affect: Mood normal.       04/27/2022    3:00 PM  Results of the Epworth flowsheet  Sitting and reading 1  Watching TV 1  Sitting, inactive in a public place (e.g. a theatre or a meeting) 0  As a passenger in a car for an hour without a break 3  Lying down to rest in the afternoon when circumstances permit 3  Sitting and talking to someone 0  Sitting quietly after a lunch without alcohol 2  In a car, while stopped for a few minutes in traffic 1  Total score 11     Data Reviewed: No recent chest x-ray or PFT in the past  Assessment:  Moderate probability of significant obstructive sleep apnea  Snoring  Bronchitis  Possible history of asthma  Pathophysiology of sleep disordered breathing Treatment options discussed with patient  Class III obesity  Plan/Recommendations: Schedule patient for home sleep testing  Schedule for pulmonary function testing  Prescription for Augmentin Prescription for prednisone  Continue albuterol  Inhaler technique was reviewed today and good   Sherrilyn Rist MD [facility] 04/27/2022, 3:34 PM  CC: Pleas Koch, NP

## 2022-06-07 ENCOUNTER — Other Ambulatory Visit: Payer: Self-pay | Admitting: Orthopedic Surgery

## 2022-06-07 DIAGNOSIS — M2342 Loose body in knee, left knee: Secondary | ICD-10-CM

## 2022-06-25 ENCOUNTER — Ambulatory Visit
Admission: RE | Admit: 2022-06-25 | Discharge: 2022-06-25 | Disposition: A | Discharge status: Home / Self Care | Payer: Managed Care, Other (non HMO) | Source: Ambulatory Visit | Attending: Orthopedic Surgery | Admitting: Orthopedic Surgery

## 2022-06-25 DIAGNOSIS — M2342 Loose body in knee, left knee: Secondary | ICD-10-CM

## 2022-07-06 ENCOUNTER — Other Ambulatory Visit: Payer: Self-pay | Admitting: Pulmonary Disease

## 2022-07-21 ENCOUNTER — Ambulatory Visit: Payer: Managed Care, Other (non HMO)

## 2022-07-21 DIAGNOSIS — R0609 Other forms of dyspnea: Secondary | ICD-10-CM

## 2022-07-21 LAB — PULMONARY FUNCTION TEST
DL/VA % pred: 141 %
DL/VA: 6.05 ml/min/mmHg/L
DLCO cor % pred: 118 %
DLCO cor: 24.75 ml/min/mmHg
DLCO unc % pred: 118 %
DLCO unc: 24.75 ml/min/mmHg
FEF 25-75 Post: 2.71 L/sec
FEF 25-75 Pre: 2.91 L/sec
FEF2575-%Change-Post: -7 %
FEF2575-%Pred-Post: 100 %
FEF2575-%Pred-Pre: 108 %
FEV1-%Change-Post: 0 %
FEV1-%Pred-Post: 87 %
FEV1-%Pred-Pre: 88 %
FEV1-Post: 2.42 L
FEV1-Pre: 2.43 L
FEV1FVC-%Change-Post: -1 %
FEV1FVC-%Pred-Pre: 106 %
FEV6-%Change-Post: 1 %
FEV6-%Pred-Post: 85 %
FEV6-%Pred-Pre: 84 %
FEV6-Post: 2.9 L
FEV6-Pre: 2.86 L
FEV6FVC-%Pred-Post: 102 %
FEV6FVC-%Pred-Pre: 102 %
FVC-%Change-Post: 1 %
FVC-%Pred-Post: 83 %
FVC-%Pred-Pre: 81 %
FVC-Post: 2.91 L
FVC-Pre: 2.87 L
Post FEV1/FVC ratio: 83 %
Post FEV6/FVC ratio: 100 %
Pre FEV1/FVC ratio: 85 %
Pre FEV6/FVC Ratio: 100 %
RV % pred: 71 %
RV: 1.3 L
TLC % pred: 82 %
TLC: 4.18 L

## 2022-07-21 NOTE — Progress Notes (Signed)
PFT done today. 

## 2022-07-25 ENCOUNTER — Ambulatory Visit: Payer: Managed Care, Other (non HMO)

## 2022-07-25 DIAGNOSIS — G4733 Obstructive sleep apnea (adult) (pediatric): Secondary | ICD-10-CM

## 2022-07-25 DIAGNOSIS — R0683 Snoring: Secondary | ICD-10-CM

## 2022-07-27 ENCOUNTER — Ambulatory Visit: Payer: Managed Care, Other (non HMO) | Admitting: Pulmonary Disease

## 2022-07-27 ENCOUNTER — Encounter: Payer: Self-pay | Admitting: Pulmonary Disease

## 2022-07-27 ENCOUNTER — Telehealth: Payer: Self-pay | Admitting: Pulmonary Disease

## 2022-07-27 VITALS — BP 122/76 | HR 94 | Ht 64.0 in | Wt 290.0 lb

## 2022-07-27 DIAGNOSIS — R0683 Snoring: Secondary | ICD-10-CM

## 2022-07-27 DIAGNOSIS — G4733 Obstructive sleep apnea (adult) (pediatric): Secondary | ICD-10-CM

## 2022-07-27 NOTE — Telephone Encounter (Signed)
Call patient  Sleep study result  Date of study: 07/25/2022  Impression: Moderate obstructive sleep apnea Moderate oxygen desaturations  Recommendation: DME referral  Recommend CPAP therapy for moderate obstructive sleep apnea  Auto titrating CPAP with pressure settings of 5-15 will be appropriate  Encourage weight loss measures  Follow-up in the office 4 to 6 weeks following initiation of treatment

## 2022-07-27 NOTE — Patient Instructions (Signed)
Prescription will be sent to medical supply company to set you up with a CPAP  Weight loss efforts as tolerated  I will see you within about 3 months of starting your CPAP therapy  There is no contraindication to you undergoing any surgical intervention at present

## 2022-07-27 NOTE — Progress Notes (Signed)
Susan Morales    [id number]    04-26-1971  Primary Care Physician:Clark, Leticia Penna, NP  Referring Physician: Pleas Koch, NP [place],  [place] [address]  Chief complaint:   Follow-up for shortness of breath  HPI: Shortness of breath is better  Symptoms of upper respiratory tract infection that she had during the last visit have resolved  Had a sleep study showing moderate obstructive sleep apnea  Snoring, no witnessed apneas She does have dryness of the mouth in the mornings, morning headaches No night sweats Memory is good Usually goes to bed about 9 PM, takes about 30 minutes to fall asleep Final wake up time by 5:50 AM Up to 4 awakenings sometimes  She has always had some chest tightness chest discomfort with any kind of respiratory infection  Not sure whether she has ever been told about asthma  Never smoker  Weight has been up and down  Outpatient Encounter Medications as of 07/27/2022  Medication Sig   albuterol (VENTOLIN HFA) 108 (90 Base) MCG/ACT inhaler Inhale 1-2 puffs into the lungs every 6 (six) hours as needed for wheezing or shortness of breath.   APPLE CIDER VINEGAR PO Take by mouth.   cetirizine (ZYRTEC) 10 MG tablet Take 30 mg by mouth daily.    ESTERIFIED ESTROGENS PO Take by mouth.   FLUoxetine (PROZAC) 20 MG tablet TAKE 2 TABLETS BY MOUTH once daily FOR ANXIETY.   hydrOXYzine (ATARAX/VISTARIL) 10 MG tablet Take 1-2 tablets (10-20 mg total) by mouth 2 (two) times daily as needed for anxiety.   omeprazole (PRILOSEC) 10 MG capsule Take 10 mg by mouth daily.   Probiotic Product (PROBIOTIC DAILY PO) Take by mouth.   [DISCONTINUED] predniSONE (DELTASONE) 20 MG tablet Take 1 tablet (20 mg total) by mouth daily with breakfast.   No facility-administered encounter medications on file as of 07/27/2022.    Allergies as of 07/27/2022   (No Known Allergies)    Past Medical History:  Diagnosis Date   Adenomyosis  12/17/2018   Allergy    Complication of anesthesia    Deviate septum, combative   COVID-19 virus infection 03/06/2020   Depression    GERD (gastroesophageal reflux disease)    Hemorrhoid    Pelvic pain 12/17/2018   UTI (urinary tract infection)     Past Surgical History:  Procedure Laterality Date   ABLATION  2015   DIRECT LARYNGOSCOPY N/A 01/29/2013   Procedure: DIRECT LARYNGOSCOPY, esophagoscopy, bronchoscopy, ;  Surgeon: Ruby Cola, MD;  Location: [facility];  Service: ENT;  Laterality: N/A;   LAPAROSCOPIC VAGINAL HYSTERECTOMY WITH SALPINGECTOMY Bilateral 12/17/2018   Procedure: LAPAROSCOPIC ASSISTED VAGINAL HYSTERECTOMY WITH SALPINGECTOMY;  Surgeon: Dian Queen, MD;  Location: [facility];  Service: Gynecology;  Laterality: Bilateral;   UPPER GASTROINTESTINAL ENDOSCOPY      Family History  Adopted: Yes  Problem Relation Age of Onset   Breast cancer Mother        in 20's   Breast cancer Paternal Grandmother    Colon cancer Neg Hx     Social History   Socioeconomic History   Marital status: Married    Spouse name: Not on file   Number of children: Not on file   Years of education: Not on file   Highest education level: Not on file  Occupational History   Not on file  Tobacco Use   Smoking status: Never   Smokeless tobacco: Never  Vaping Use   Vaping Use: Never used  Substance and Sexual Activity   Alcohol use: No    Alcohol/week: 0.0 standard drinks of alcohol   Drug use: No   Sexual activity: Yes  Other Topics Concern   Not on file  Social History Narrative   Married.   4 children.   Works as a Print production planner.   Enjoys reading, spending time with her family.   Social Determinants of Health   Financial Resource Strain: Not on file  Food Insecurity: Not on file  Transportation Needs: Not on file  Physical Activity: Not on file  Stress: Not on file  Social Connections: Not on file  Intimate Partner Violence: Not on file     Review of Systems  Constitutional:  Positive for fatigue.  Psychiatric/Behavioral:  Positive for sleep disturbance.     Vitals:   07/27/22 1534  BP: 122/76  Pulse: 94  SpO2: 99%     Physical Exam Constitutional:      Appearance: She is obese.  HENT:     Head: Normocephalic.     Mouth/Throat:     Mouth: Mucous membranes are moist.  Cardiovascular:     Rate and Rhythm: Normal rate and regular rhythm.     Heart sounds: No murmur heard.    No friction rub.  Pulmonary:     Effort: No respiratory distress.     Breath sounds: No stridor. No wheezing or rhonchi.  Musculoskeletal:     Cervical back: No rigidity or tenderness.  Neurological:     Mental Status: She is alert.  Psychiatric:        Mood and Affect: Mood normal.      04/27/2022    3:00 PM  Results of the Epworth flowsheet  Sitting and reading 1  Watching TV 1  Sitting, inactive in a public place (e.g. a theatre or a meeting) 0  As a passenger in a car for an hour without a break 3  Lying down to rest in the afternoon when circumstances permit 3  Sitting and talking to someone 0  Sitting quietly after a lunch without alcohol 2  In a car, while stopped for a few minutes in traffic 1  Total score 11     Data Reviewed: PFT reviewed with the patient today showing no obstruction, no significant bronchodilator response, no restriction, normal diffusing capacity  Sleep study reviewed with the patient showing moderate obstructive sleep apnea with recommendation for CPAP therapy  Assessment:  Moderate obstructive sleep apnea  Class III obesity  Pathophysiology of sleep disordered breathing reviewed Treatment options reviewed  Sleep study showing moderate obstructive sleep apnea, significant benefit to treatment with CPAP therapy   Plan/Recommendations:  DME referral for CPAP Auto CPAP 5-15  Albuterol use as needed  Graded activities as tolerated for weight loss  Discuss treatment options for  sleep disordered breathing, discussed benefits for treatment of sleep disordered breathing  Has no contraindications to surgical intervention at present  Sherrilyn Rist MD [facility] 07/27/2022, 4:04 PM  CC: Pleas Koch, NP

## 2022-07-27 NOTE — Telephone Encounter (Signed)
Appt to discuss scheduled for this afternoon

## 2022-07-29 ENCOUNTER — Telehealth: Payer: Self-pay | Admitting: Pulmonary Disease

## 2022-07-29 NOTE — Telephone Encounter (Signed)
Fax received from Dr. Charlies Constable with [facility] to perform a LEFT KNEE SCOPE WITH GENERAL ANESTHESIA on patient.  Patient needs surgery clearance. Surgery is 09/14/2022. Patient was seen on 07/27/2022. Office protocol is a risk assessment can be sent to surgeon if patient has been seen in 60 days or less.   Sending to Dr Ander Slade for risk assessment or recommendations if patient needs to be seen in office prior to surgical procedure.

## 2022-07-29 NOTE — Telephone Encounter (Signed)
OV notes and clearance form have been faxed back to [facility]. Nothing further needed at this time.  °

## 2022-07-29 NOTE — Telephone Encounter (Signed)
Cleared for surgery  Does not have any acutely reversible problems to address prior to surgery  Patient has obstructive sleep apnea and will be prescribed CPAP therapy

## 2022-11-07 ENCOUNTER — Telehealth: Payer: Self-pay

## 2022-11-07 DIAGNOSIS — E1165 Type 2 diabetes mellitus with hyperglycemia: Secondary | ICD-10-CM

## 2022-11-07 DIAGNOSIS — E785 Hyperlipidemia, unspecified: Secondary | ICD-10-CM

## 2022-11-07 NOTE — Telephone Encounter (Signed)
See phone note

## 2022-11-07 NOTE — Telephone Encounter (Signed)
Patient called states she needed to have lab appointment. Do not see any orders. Advised will send message and someone will call to set up appointment.

## 2022-11-08 NOTE — Telephone Encounter (Signed)
Called and scheduled patient lab appt. 11/22/22

## 2022-11-08 NOTE — Telephone Encounter (Signed)
Noted, lab orders placed.  Needs to schedule lab appointment. Needs to be fasting 4 hours prior. Water and black coffee is fine.

## 2022-11-08 NOTE — Telephone Encounter (Signed)
Addressed through phone encounter

## 2022-11-08 NOTE — Telephone Encounter (Signed)
Scheduled patient for CPE in July.  Patient would like to have labs checked now, she feels she has gained a lot of weight that has affected her A1c and cholesterol. She stated she was hoping to not have to wait until her CPE. Please advise

## 2022-11-08 NOTE — Addendum Note (Signed)
Addended by: Doreene Nest on: 11/08/2022 10:41 AM   Modules accepted: Orders

## 2022-11-08 NOTE — Telephone Encounter (Signed)
It looks like she's due for CPE in mid July. Does she want to wait until then for labs?  Did someone ask her to have labs sooner? Let's get her scheduled for CPE.

## 2022-11-22 ENCOUNTER — Other Ambulatory Visit: Payer: Managed Care, Other (non HMO)

## 2022-11-25 ENCOUNTER — Telehealth: Payer: Self-pay

## 2022-11-25 ENCOUNTER — Other Ambulatory Visit (HOSPITAL_COMMUNITY): Payer: Self-pay

## 2022-11-25 NOTE — Telephone Encounter (Signed)
Pharmacy Patient Advocate Encounter  Prior Authorization for FLUoxetine HCl 20MG  tablets has been APPROVED by [employer] from 09/28/2022 to 10/28/2023.   [id number]  Copay is $20.00 90 day supply  Melanee Spry CPhT Rx Patient Advocate [phone]

## 2023-01-09 ENCOUNTER — Ambulatory Visit
Admission: RE | Admit: 2023-01-09 | Discharge: 2023-01-09 | Disposition: A | Discharge status: Home / Self Care | Payer: Managed Care, Other (non HMO) | Source: Ambulatory Visit | Attending: Primary Care | Admitting: Primary Care

## 2023-01-09 ENCOUNTER — Other Ambulatory Visit (HOSPITAL_COMMUNITY): Payer: Self-pay

## 2023-01-09 ENCOUNTER — Ambulatory Visit (INDEPENDENT_AMBULATORY_CARE_PROVIDER_SITE_OTHER): Payer: Managed Care, Other (non HMO) | Admitting: Primary Care

## 2023-01-09 ENCOUNTER — Telehealth: Payer: Self-pay

## 2023-01-09 ENCOUNTER — Encounter: Payer: Self-pay | Admitting: Primary Care

## 2023-01-09 VITALS — BP 124/86 | HR 84 | Temp 98.4°F | Ht 63.75 in | Wt 286.4 lb

## 2023-01-09 DIAGNOSIS — E1165 Type 2 diabetes mellitus with hyperglycemia: Secondary | ICD-10-CM

## 2023-01-09 DIAGNOSIS — G4733 Obstructive sleep apnea (adult) (pediatric): Secondary | ICD-10-CM

## 2023-01-09 DIAGNOSIS — R5383 Other fatigue: Secondary | ICD-10-CM | POA: Diagnosis not present

## 2023-01-09 DIAGNOSIS — E785 Hyperlipidemia, unspecified: Secondary | ICD-10-CM

## 2023-01-09 DIAGNOSIS — Z6841 Body Mass Index (BMI) 40.0 and over, adult: Secondary | ICD-10-CM

## 2023-01-09 DIAGNOSIS — K219 Gastro-esophageal reflux disease without esophagitis: Secondary | ICD-10-CM

## 2023-01-09 DIAGNOSIS — Z0000 Encounter for general adult medical examination without abnormal findings: Secondary | ICD-10-CM | POA: Diagnosis not present

## 2023-01-09 DIAGNOSIS — F32A Depression, unspecified: Secondary | ICD-10-CM

## 2023-01-09 DIAGNOSIS — Z1231 Encounter for screening mammogram for malignant neoplasm of breast: Secondary | ICD-10-CM

## 2023-01-09 DIAGNOSIS — Z0001 Encounter for general adult medical examination with abnormal findings: Secondary | ICD-10-CM

## 2023-01-09 DIAGNOSIS — F419 Anxiety disorder, unspecified: Secondary | ICD-10-CM

## 2023-01-09 LAB — LIPID PANEL
Cholesterol: 293 mg/dL — ABNORMAL HIGH (ref 0–200)
HDL: 72.6 mg/dL (ref >39.00)
LDL Cholesterol: 205 mg/dL — ABNORMAL HIGH (ref 0–99)
NonHDL: 220.19
Total CHOL/HDL Ratio: 4
Triglycerides: 76 mg/dL (ref 0.0–149.0)
VLDL: 15.2 mg/dL (ref 0.0–40.0)

## 2023-01-09 LAB — COMPREHENSIVE METABOLIC PANEL
ALT: 21 U/L (ref 0–35)
AST: 14 U/L (ref 0–37)
Albumin: 4.3 g/dL (ref 3.5–5.2)
Alkaline Phosphatase: 76 U/L (ref 39–117)
BUN: 18 mg/dL (ref 6–23)
CO2: 28 mEq/L (ref 19–32)
Calcium: 9.5 mg/dL (ref 8.4–10.5)
Chloride: 100 mEq/L (ref 96–112)
Creatinine, Ser: 0.72 mg/dL (ref 0.40–1.20)
GFR: 95.56 mL/min (ref >60.00)
Glucose, Bld: 93 mg/dL (ref 70–99)
Potassium: 4 mEq/L (ref 3.5–5.1)
Sodium: 136 mEq/L (ref 135–145)
Total Bilirubin: 0.7 mg/dL (ref 0.2–1.2)
Total Protein: 7.8 g/dL (ref 6.0–8.3)

## 2023-01-09 LAB — MICROALBUMIN / CREATININE URINE RATIO
Creatinine,U: 109.2 mg/dL
Microalb Creat Ratio: 1.7 mg/g (ref 0.0–30.0)
Microalb, Ur: 1.8 mg/dL (ref 0.0–1.9)

## 2023-01-09 LAB — HEMOGLOBIN A1C: Hgb A1c MFr Bld: 6.1 % (ref 4.6–6.5)

## 2023-01-09 MED ORDER — OZEMPIC (0.25 OR 0.5 MG/DOSE) 2 MG/3ML ~~LOC~~ SOPN: PEN_INJECTOR | SUBCUTANEOUS | 0 refills | Status: DC

## 2023-01-09 NOTE — Telephone Encounter (Signed)
Pharmacy Patient Advocate Encounter  Received notification from [employer] that Prior Authorization for Ozempic (0.25 or 0.5 MG/DOSE) 2MG /3ML pen-injectors has been APPROVED from 12/10/22 to 01/09/24.Marland Kitchen  PA #/Case ID/Reference #: [id number]  Spoke with Pharmacy to process. Copay is $70 for 84 day supply.

## 2023-01-09 NOTE — Assessment & Plan Note (Signed)
Following with pulmonology.  Working to get dental appliance.   Reviewed office notes from January 2024.

## 2023-01-09 NOTE — Assessment & Plan Note (Signed)
Controlled overall.   Continue fluoxetine 40 mg daily.

## 2023-01-09 NOTE — Assessment & Plan Note (Signed)
Discussed options, she would like to try GLP 1 agonist.  Start semaglutide (Ozempic) for diabetes/weight loss. Start by injecting 0.25 mg into the skin once weekly for 4 weeks, then increase to 0.5 mg once weekly thereafter.  Follow up in 3 months.

## 2023-01-09 NOTE — Assessment & Plan Note (Signed)
Immunizations UTD. Mammogram due, orders placed. Colonoscopy UTD, due 2027  Discussed the importance of a healthy diet and regular exercise in order for weight loss, and to reduce the risk of further co-morbidity.  Exam stable. Labs pending.  Follow up in 1 year for repeat physical.

## 2023-01-09 NOTE — Telephone Encounter (Signed)
Pharmacy Patient Advocate Encounter   Received notification from [employer] that prior authorization for Ozempic (0.25 or 0.5 MG/DOSE) 2MG /3ML pen-injectors is required/requested.   Insurance verification completed.   The patient is insured through [employer] .   Per test claim: PA submitted to [employer] via [employer] Key/confirmation #/EOC [id number] Status is pending

## 2023-01-09 NOTE — Assessment & Plan Note (Signed)
Controlled.  Continue omeprazole 10 mg daily.

## 2023-01-09 NOTE — Progress Notes (Signed)
Subjective:    Patient ID: Susan Morales, female    DOB: 03-02-1971, 52 y.o.   MRN: [id number]  HPI  Susan Morales is a very pleasant 52 y.o. female who presents today for complete physical and follow up of chronic conditions.  She is requesting help with her obesity. Chronic obesity since young adulthood. She's currently struggling with chronic knee pain, has had difficulty with both knees. She's gained weight since being out of work for her knees. She lost 50 pounds with the keto diet, gained it all back. Also lost 60 pounds in the past with supplements and calorie restriction.   Diet currently consists of:  Breakfast: Eggs, [place] bacon, muffin Lunch: Left overs, sandwich, salad, yogurt Dinner: Casserole, eggs, protein Snacks: Fruit, nuts Desserts: Daily  Beverages: Water, diet Coke a few times weekly  Exercise: None   Wt Readings from Last 3 Encounters:  01/09/23 286 lb 6.4 oz (129.9 kg)  07/27/22 290 lb (131.5 kg)  04/27/22 276 lb (125.2 kg)     Immunizations: -Tetanus: Completed in 2022 -Shingles: Completed Shingrix dose x 1     Eye exam: Completes annually  Dental exam: Completes semi-annually    Pap Smear: Hysterectomy Mammogram: June 202  Colonoscopy: Completed in 2022, due 2027  BP Readings from Last 3 Encounters:  01/09/23 124/86  07/27/22 122/76  04/27/22 116/78       Review of Systems  Constitutional:  Negative for unexpected weight change.  HENT:  Negative for rhinorrhea.   Respiratory:  Negative for cough and shortness of breath.   Cardiovascular:  Negative for chest pain.  Gastrointestinal:  Negative for constipation and diarrhea.  Genitourinary:  Negative for difficulty urinating.  Musculoskeletal:  Positive for arthralgias.  Skin:  Negative for rash.  Allergic/Immunologic: Negative for environmental allergies.  Neurological:  Negative for dizziness and headaches.  Psychiatric/Behavioral:  The patient is nervous/anxious.           Past Medical History:  Diagnosis Date   Adenomyosis 12/17/2018   Allergy    Complication of anesthesia    Deviate septum, combative   COVID-19 virus infection 03/06/2020   Depression    GERD (gastroesophageal reflux disease)    Hemorrhoid    Pelvic pain 12/17/2018   UTI (urinary tract infection)     Social History   Socioeconomic History   Marital status: Married    Spouse name: Not on file   Number of children: Not on file   Years of education: Not on file   Highest education level: Not on file  Occupational History   Not on file  Tobacco Use   Smoking status: Never   Smokeless tobacco: Never  Vaping Use   Vaping status: Never Used  Substance and Sexual Activity   Alcohol use: No    Alcohol/week: 0.0 standard drinks of alcohol   Drug use: No   Sexual activity: Yes  Other Topics Concern   Not on file  Social History Narrative   Married.   4 children.   Works as a Manufacturing systems engineer.   Enjoys reading, spending time with her family.   Social Determinants of Health   Financial Resource Strain: Not on file  Food Insecurity: Not on file  Transportation Needs: Not on file  Physical Activity: Not on file  Stress: Not on file  Social Connections: Not on file  Intimate Partner Violence: Not on file    Past Surgical History:  Procedure Laterality Date   ABLATION  2015  DIRECT LARYNGOSCOPY N/A 01/29/2013   Procedure: DIRECT LARYNGOSCOPY, esophagoscopy, bronchoscopy, ;  Surgeon: Melvenia Beam, MD;  Location: [facility];  Service: ENT;  Laterality: N/A;   LAPAROSCOPIC VAGINAL HYSTERECTOMY WITH SALPINGECTOMY Bilateral 12/17/2018   Procedure: LAPAROSCOPIC ASSISTED VAGINAL HYSTERECTOMY WITH SALPINGECTOMY;  Surgeon: Marcelle Overlie, MD;  Location: [facility];  Service: Gynecology;  Laterality: Bilateral;   UPPER GASTROINTESTINAL ENDOSCOPY      Family History  Adopted: Yes  Problem Relation Age of Onset   Breast cancer Mother        in 67's   Breast  cancer Paternal Grandmother    Colon cancer Neg Hx     No Known Allergies  Current Outpatient Medications on File Prior to Visit  Medication Sig Dispense Refill   albuterol (VENTOLIN HFA) 108 (90 Base) MCG/ACT inhaler Inhale 1-2 puffs into the lungs every 6 (six) hours as needed for wheezing or shortness of breath.     APPLE CIDER VINEGAR PO Take by mouth.     cetirizine (ZYRTEC) 10 MG tablet Take 30 mg by mouth daily.      diclofenac (VOLTAREN) 75 MG EC tablet Take 75 mg by mouth 2 (two) times daily as needed.     ESTERIFIED ESTROGENS PO Take by mouth.     FLUoxetine (PROZAC) 20 MG tablet TAKE 2 TABLETS BY MOUTH once daily FOR ANXIETY. 180 tablet 3   hydrOXYzine (ATARAX/VISTARIL) 10 MG tablet Take 1-2 tablets (10-20 mg total) by mouth 2 (two) times daily as needed for anxiety. 30 tablet 0   naproxen (NAPROSYN) 500 MG tablet Take by mouth as needed.     omeprazole (PRILOSEC) 10 MG capsule Take 10 mg by mouth daily.     Probiotic Product (PROBIOTIC DAILY PO) Take by mouth.     No current facility-administered medications on file prior to visit.    BP 124/86   Pulse 84   Temp 98.4 F (36.9 C) (Temporal)   Ht 5' 3.75" (1.619 m)   Wt 286 lb 6.4 oz (129.9 kg)   LMP 08/11/2018   SpO2 99%   BMI 49.55 kg/m  Objective:   Physical Exam HENT:     Right Ear: Tympanic membrane and ear canal normal.     Left Ear: Tympanic membrane and ear canal normal.     Nose: Nose normal.  Eyes:     Conjunctiva/sclera: Conjunctivae normal.     Pupils: Pupils are equal, round, and reactive to light.  Neck:     Thyroid: No thyromegaly.  Cardiovascular:     Rate and Rhythm: Normal rate and regular rhythm.     Heart sounds: No murmur heard. Pulmonary:     Effort: Pulmonary effort is normal.     Breath sounds: Normal breath sounds. No rales.  Abdominal:     General: Bowel sounds are normal.     Palpations: Abdomen is soft.     Tenderness: There is no abdominal tenderness.  Musculoskeletal:         General: Normal range of motion.     Cervical back: Neck supple.  Lymphadenopathy:     Cervical: No cervical adenopathy.  Skin:    General: Skin is warm and dry.     Findings: No rash.  Neurological:     Mental Status: She is alert and oriented to person, place, and time.     Cranial Nerves: No cranial nerve deficit.     Deep Tendon Reflexes: Reflexes are normal and symmetric.  Psychiatric:  Mood and Affect: Mood normal.           Assessment & Plan:  Encounter for annual general medical examination with abnormal findings in adult Assessment & Plan: Immunizations UTD. Mammogram due, orders placed. Colonoscopy UTD, due 2027  Discussed the importance of a healthy diet and regular exercise in order for weight loss, and to reduce the risk of further co-morbidity.  Exam stable. Labs pending.  Follow up in 1 year for repeat physical.   Orders: -     Comprehensive metabolic panel -     Microalbumin / creatinine urine ratio -     Lipid panel  Controlled type 2 diabetes mellitus with hyperglycemia, without long-term current use of insulin (HCC) Assessment & Plan: Repeat A1C pending.  Discussed options for obesity and diabetes. She is interested in Ozempic.  Start semaglutide (Ozempic) for diabetes/weight loss. Start by injecting 0.25 mg into the skin once weekly for 4 weeks, then increase to 0.5 mg once weekly thereafter.   Urine microalbumin due and pending.  Follow up in 3 months   Orders: -     Comprehensive metabolic panel -     Microalbumin / creatinine urine ratio -     Hemoglobin A1c -     Ozempic (0.25 or 0.5 MG/DOSE); Inject 0.25 mg into the skin once weekly for 4 weeks, then increase to 0.5 mg once weekly thereafter for diabetes.  Dispense: 9 mL; Refill: 0  Hyperlipidemia, unspecified hyperlipidemia type -     Lipid panel -     Lipoprotein A (LPA)  Other fatigue  Moderate obstructive sleep apnea Assessment & Plan: Following with pulmonology.   Working to get dental appliance.   Reviewed office notes from January 2024.   Gastroesophageal reflux disease, unspecified whether esophagitis present Assessment & Plan: Controlled.  Continue omeprazole 10 mg daily.   Anxiety and depression Assessment & Plan: Controlled overall.   Continue fluoxetine 40 mg daily.   Class 3 severe obesity due to excess calories with serious comorbidity and body mass index (BMI) of 45.0 to 49.9 in adult [facility]) Assessment & Plan: Discussed options, she would like to try GLP 1 agonist.  Start semaglutide (Ozempic) for diabetes/weight loss. Start by injecting 0.25 mg into the skin once weekly for 4 weeks, then increase to 0.5 mg once weekly thereafter.  Follow up in 3 months.   Screening mammogram for breast cancer -     3D Screening Mammogram, Left and Right; Future        Doreene Nest, NP

## 2023-01-09 NOTE — Assessment & Plan Note (Addendum)
Repeat A1C pending.  Discussed options for obesity and diabetes. She is interested in Ozempic.  Start semaglutide (Ozempic) for diabetes/weight loss. Start by injecting 0.25 mg into the skin once weekly for 4 weeks, then increase to 0.5 mg once weekly thereafter.   Urine microalbumin due and pending.  Follow up in 3 months

## 2023-01-09 NOTE — Patient Instructions (Addendum)
Start semaglutide (Ozempic) for diabetes/weight loss. Start by injecting 0.25 mg into the skin once weekly for 4 weeks, then increase to 0.5 mg once weekly thereafter.   Stop by the lab prior to leaving today. I will notify you of your results once received.   [facility].   Please schedule a follow up visit for 3 months.  It was a pleasure to see you today!

## 2023-01-11 ENCOUNTER — Encounter: Payer: Managed Care, Other (non HMO) | Admitting: Primary Care

## 2023-01-12 LAB — LIPOPROTEIN A (LPA): Lipoprotein (a): <10 nmol/L (ref <75)

## 2023-01-30 IMAGING — MG MM DIGITAL SCREENING BILAT W/ TOMO AND CAD
6 of 12 series · 6 of 36 positions shown · non-contrast
Comparison: Previous exam(s).

ACR Breast Density Category a: The breast tissue is almost entirely
fatty.

CLINICAL DATA: Screening.

EXAM:
DIGITAL SCREENING BILATERAL MAMMOGRAM WITH TOMOSYNTHESIS AND CAD
TECHNIQUE: Bilateral screening digital craniocaudal and mediolateral oblique
mammograms were obtained. Bilateral screening digital breast
tomosynthesis was performed. The images were evaluated with
computer-aided detection.

[L CC synth-2D (1 of 2)]
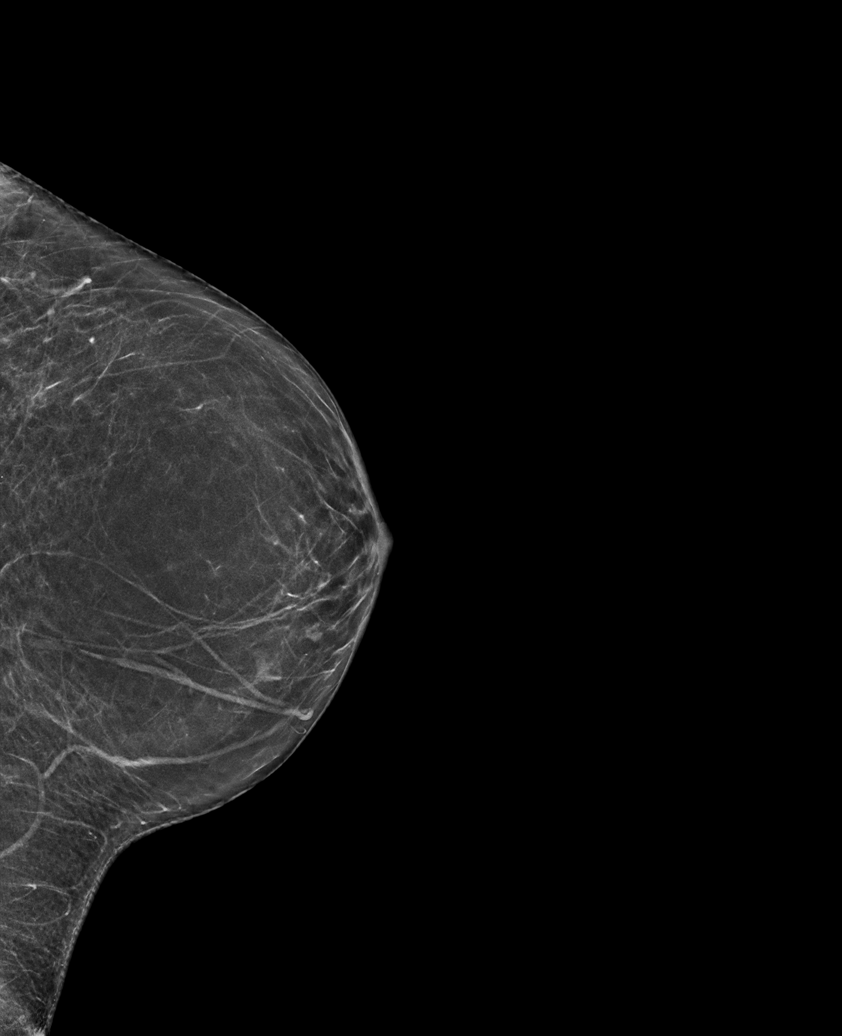

[L CC synth-2D (2 of 2)]
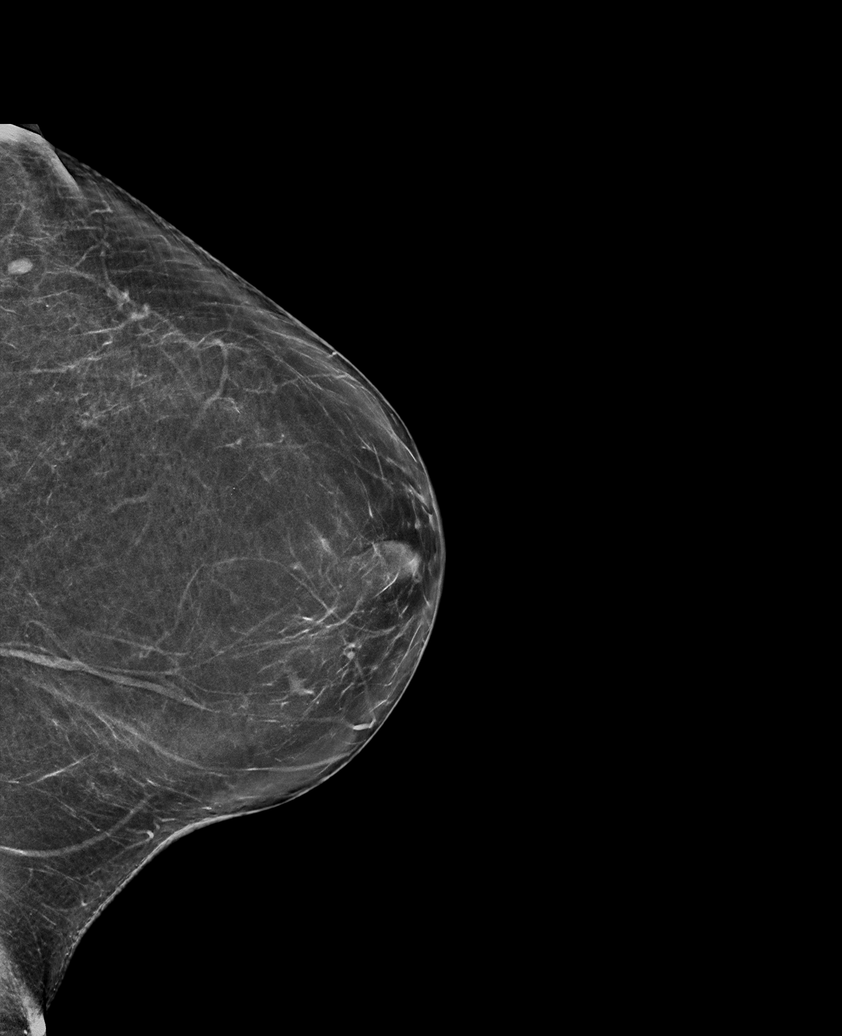

[R CC synth-2D (1 of 2)]
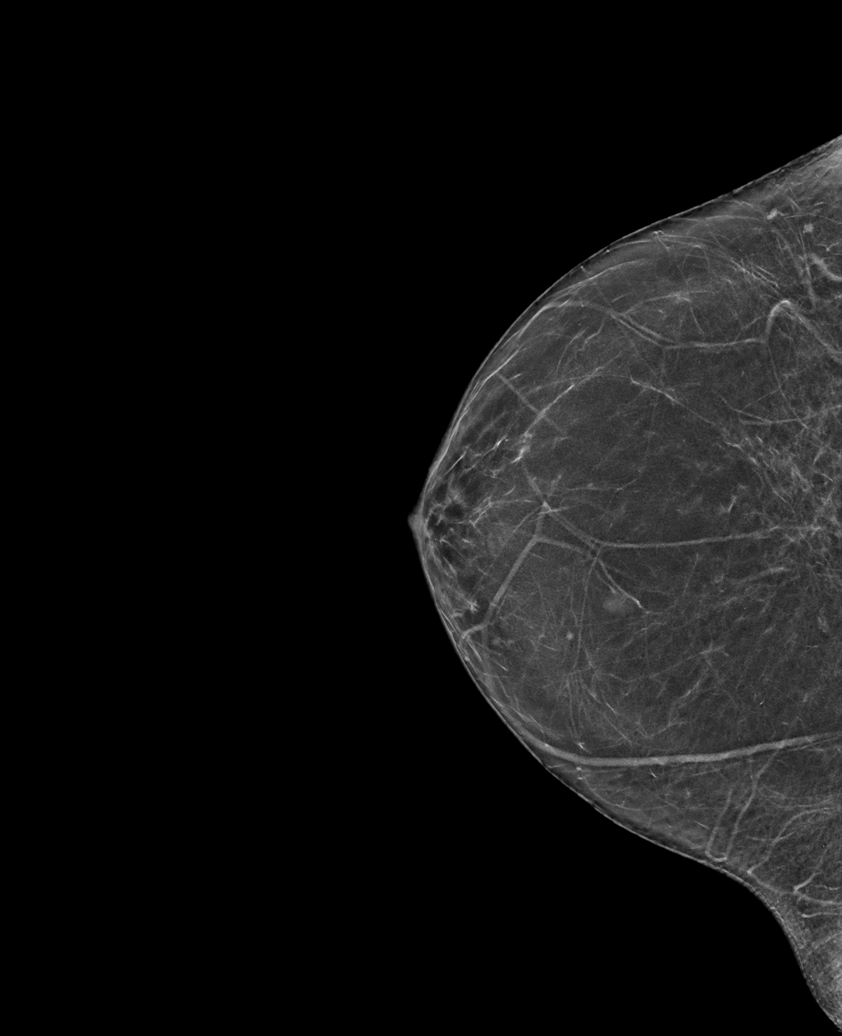

[L MLO synth-2D]
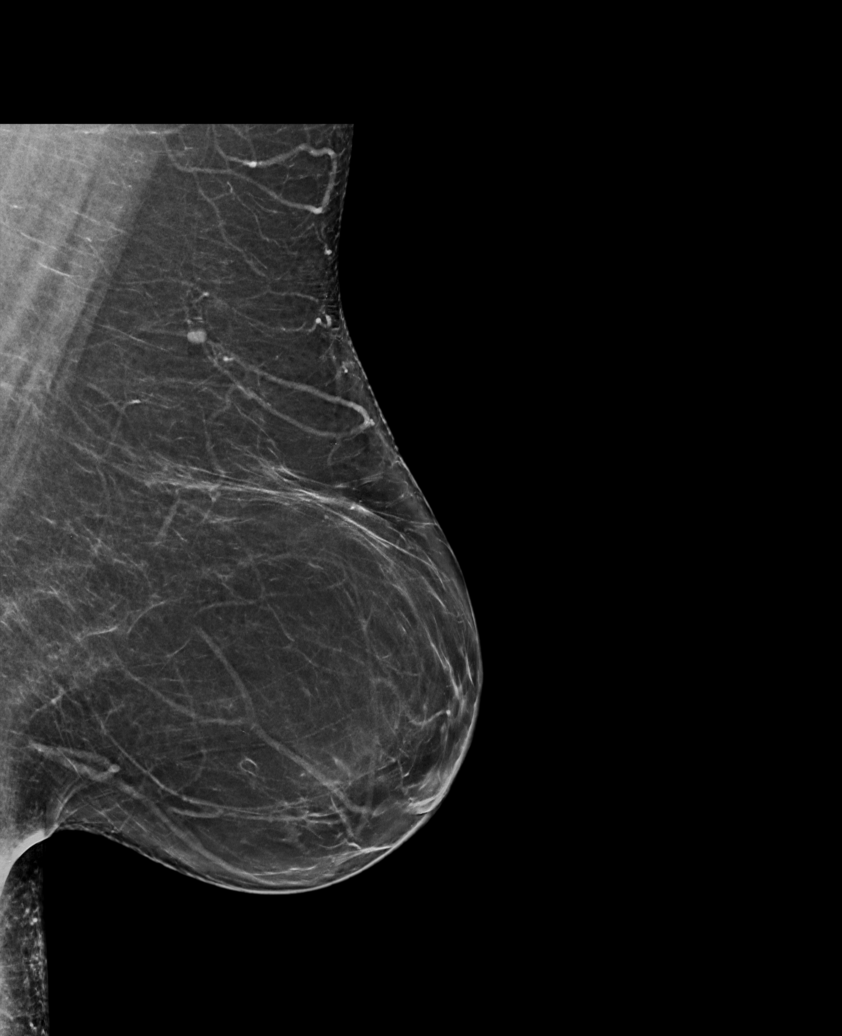

[R MLO synth-2D]
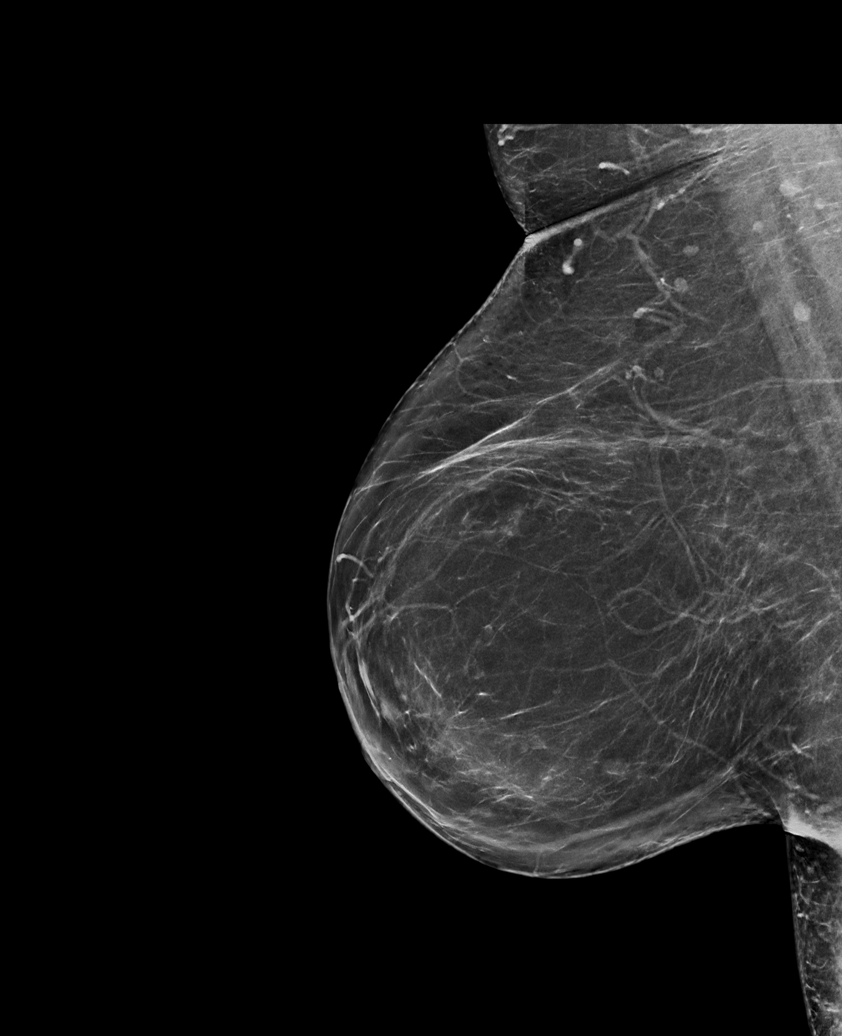

[R CC synth-2D (2 of 2)]
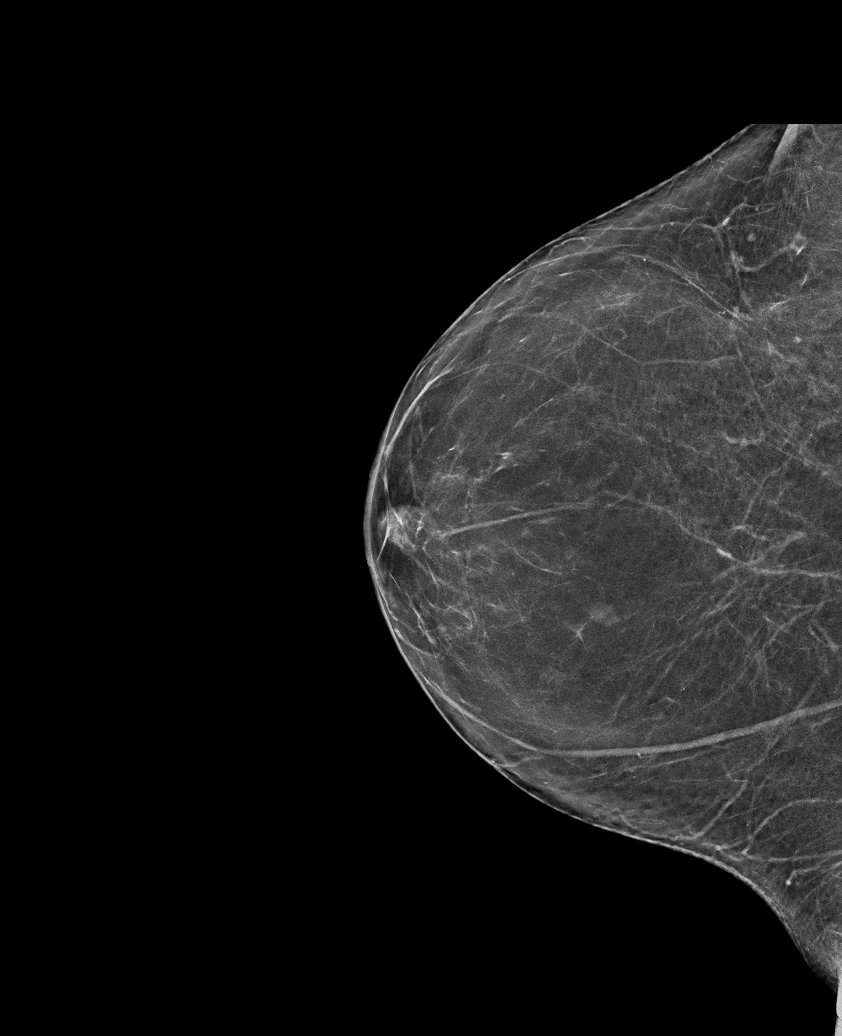

[6 of 36 positions shown; findings below may reference images not displayed]

FINDINGS: There are no findings suspicious for malignancy.
IMPRESSION: No mammographic evidence of malignancy. A result letter of this
screening mammogram will be mailed directly to the patient.

RECOMMENDATION:
Screening mammogram in one year. (Code:0E-3-N98)

BI-RADS CATEGORY  1: Negative.

## 2023-03-09 DIAGNOSIS — E1165 Type 2 diabetes mellitus with hyperglycemia: Secondary | ICD-10-CM

## 2023-03-13 MED ORDER — SEMAGLUTIDE (1 MG/DOSE) 4 MG/3ML ~~LOC~~ SOPN: 1.0000 mg | PEN_INJECTOR | SUBCUTANEOUS | 0 refills | Status: DC

## 2023-03-31 ENCOUNTER — Other Ambulatory Visit: Payer: Self-pay | Admitting: Primary Care

## 2023-03-31 DIAGNOSIS — E1165 Type 2 diabetes mellitus with hyperglycemia: Secondary | ICD-10-CM

## 2023-04-11 ENCOUNTER — Ambulatory Visit: Payer: Managed Care, Other (non HMO) | Admitting: Primary Care

## 2023-04-17 ENCOUNTER — Other Ambulatory Visit: Payer: Self-pay | Admitting: Primary Care

## 2023-04-17 DIAGNOSIS — F32A Depression, unspecified: Secondary | ICD-10-CM

## 2023-04-19 ENCOUNTER — Ambulatory Visit: Payer: Managed Care, Other (non HMO) | Admitting: Primary Care

## 2023-04-19 ENCOUNTER — Encounter: Payer: Self-pay | Admitting: Primary Care

## 2023-04-19 VITALS — BP 134/82 | HR 94 | Temp 97.2°F | Ht 63.75 in | Wt 276.0 lb

## 2023-04-19 DIAGNOSIS — Z6841 Body Mass Index (BMI) 40.0 and over, adult: Secondary | ICD-10-CM | POA: Diagnosis not present

## 2023-04-19 DIAGNOSIS — Z7984 Long term (current) use of oral hypoglycemic drugs: Secondary | ICD-10-CM | POA: Diagnosis not present

## 2023-04-19 DIAGNOSIS — E1165 Type 2 diabetes mellitus with hyperglycemia: Secondary | ICD-10-CM

## 2023-04-19 DIAGNOSIS — E66813 Obesity, class 3: Secondary | ICD-10-CM

## 2023-04-19 LAB — POCT GLYCOSYLATED HEMOGLOBIN (HGB A1C): Hemoglobin A1C: 5.6 % (ref 4.0–5.6)

## 2023-04-19 MED ORDER — SEMAGLUTIDE (2 MG/DOSE) 8 MG/3ML ~~LOC~~ SOPN: 2.0000 mg | PEN_INJECTOR | SUBCUTANEOUS | 1 refills | Status: DC

## 2023-04-19 NOTE — Assessment & Plan Note (Addendum)
Commended patient on 10 lb weight loss.  Increase Ozempic to 2 mg [place] weekly to promote further weight loss.  Discussed continuing to work on diet and exercise to promote weight loss.   Follow up in 6 months.   I evaluated patient, was consulted regarding treatment, and agree with assessment and plan per Tenna Delaine, RN, DNP student.   Mayra Reel, NP-C

## 2023-04-19 NOTE — Assessment & Plan Note (Addendum)
Controlled and improved with A1C of 5.6 today.  Increase Ozempic to 2 mg [place] weekly.   Discussed continuing to work on diet and exercise to promote weight loss.   Follow up in 6 months.   I evaluated patient, was consulted regarding treatment, and agree with assessment and plan per Tenna Delaine, RN, DNP student.   Mayra Reel, NP-C

## 2023-04-19 NOTE — Patient Instructions (Addendum)
Increase Ozempic to 2 mg injected weekly.   Continue to work on [employer] and exercise.   Schedule a follow up for 6 months.   It was a pleasure to see you today!

## 2023-04-19 NOTE — Progress Notes (Signed)
Subjective:    Patient ID: Susan Morales, female    DOB: [birth year], 52 y.o.   MRN: [id number]  HPI  Susan Morales is a very pleasant 52 y.o. female with a history of type 2 diabetes, moderate obstructive sleep apnea, anxiety and depression, fibromyalgia, class 3 severe obesity who presents today for follow up of obesity and diabetes.  She was last evaluated in mid July 2024 for her annual physical. During this visit she requested to start Ozempic for weight loss and diabetes treatment. She was initiated on Ozempic 0.25 mg weekly x 4 weeks, then 0.5 mg weekly, and for the last one month she has been on 1 mg weekly.   Since her last visit she has lost 10 pounds. She's compliant to Ozempic 1 mg weekly, has not missed doses. She has noticed increased esophageal reflux at night, has noticed improved symptoms if she eats earlier in the evening. Has been taking omeprazole a few times weekly. She denies nausea, constipation.   She's improved her diet overall, increased fruit, whole grains. She has eliminated snacking. She's feeling much better overall. Has noticed a reduction in her knee pain. Is not exercising yet, but believes she may be able to soon.  Wt Readings from Last 3 Encounters:  04/19/23 276 lb (125.2 kg)  01/09/23 286 lb 6.4 oz (129.9 kg)  07/27/22 290 lb (131.5 kg)   BP Readings from Last 3 Encounters:  04/19/23 134/82  01/09/23 124/86  07/27/22 122/76     Review of Systems  Gastrointestinal:  Negative for constipation, diarrhea and nausea.       Esophageal reflux  Neurological:  Negative for numbness.         Past Medical History:  Diagnosis Date   Adenomyosis 12/17/2018   Allergy    Complication of anesthesia    Deviate septum, combative   COVID-19 virus infection 03/06/2020   Depression    GERD (gastroesophageal reflux disease)    Hemorrhoid    Pelvic pain 12/17/2018   UTI (urinary tract infection)     Social History   Socioeconomic History    Marital status: Married    Spouse name: Not on file   Number of children: Not on file   Years of education: Not on file   Highest education level: Not on file  Occupational History   Not on file  Tobacco Use   Smoking status: Never   Smokeless tobacco: Never  Vaping Use   Vaping status: Never Used  Substance and Sexual Activity   Alcohol use: No    Alcohol/week: 0.0 standard drinks of alcohol   Drug use: No   Sexual activity: Yes  Other Topics Concern   Not on file  Social History Narrative   Married.   4 children.   Works as a Manufacturing systems engineer.   Enjoys reading, spending time with her family.   Social Determinants of Health   Financial Resource Strain: Not on file  Food Insecurity: Not on file  Transportation Needs: Not on file  Physical Activity: Not on file  Stress: Not on file  Social Connections: Not on file  Intimate Partner Violence: Not on file    Past Surgical History:  Procedure Laterality Date   ABLATION  2015   DIRECT LARYNGOSCOPY N/A 01/29/2013   Procedure: DIRECT LARYNGOSCOPY, esophagoscopy, bronchoscopy, ;  Surgeon: Melvenia Beam, MD;  Location: [facility];  Service: ENT;  Laterality: N/A;   LAPAROSCOPIC VAGINAL HYSTERECTOMY WITH SALPINGECTOMY Bilateral 12/17/2018   Procedure:  LAPAROSCOPIC ASSISTED VAGINAL HYSTERECTOMY WITH SALPINGECTOMY;  Surgeon: Marcelle Overlie, MD;  Location: [facility];  Service: Gynecology;  Laterality: Bilateral;   UPPER GASTROINTESTINAL ENDOSCOPY      Family History  Adopted: Yes  Problem Relation Age of Onset   Breast cancer Mother        in 2's   Breast cancer Paternal Grandmother    Colon cancer Neg Hx     No Known Allergies  Current Outpatient Medications on File Prior to Visit  Medication Sig Dispense Refill   albuterol (VENTOLIN HFA) 108 (90 Base) MCG/ACT inhaler Inhale 1-2 puffs into the lungs every 6 (six) hours as needed for wheezing or shortness of breath.     APPLE CIDER VINEGAR PO Take by  mouth.     cetirizine (ZYRTEC) 10 MG tablet Take 30 mg by mouth daily.      diclofenac (VOLTAREN) 75 MG EC tablet Take 75 mg by mouth 2 (two) times daily as needed.     ESTERIFIED ESTROGENS PO Take by mouth.     FLUoxetine (PROZAC) 20 MG tablet TAKE 2 TABLETS BY MOUTH ONCE DAILY FOR ANXIETY. 180 tablet 1   hydrOXYzine (ATARAX/VISTARIL) 10 MG tablet Take 1-2 tablets (10-20 mg total) by mouth 2 (two) times daily as needed for anxiety. 30 tablet 0   omeprazole (PRILOSEC) 10 MG capsule Take 10 mg by mouth daily.     Probiotic Product (PROBIOTIC DAILY PO) Take by mouth.     naproxen (NAPROSYN) 500 MG tablet Take by mouth as needed. (Patient not taking: Reported on 04/19/2023)     No current facility-administered medications on file prior to visit.    BP 134/82   Pulse 94   Temp (!) 97.2 F (36.2 C) (Temporal)   Ht 5' 3.75" (1.619 m)   Wt 276 lb (125.2 kg)   LMP 08/11/2018   SpO2 98%   BMI 47.75 kg/m  Objective:   Physical Exam Cardiovascular:     Rate and Rhythm: Normal rate and regular rhythm.  Pulmonary:     Effort: Pulmonary effort is normal.     Breath sounds: Normal breath sounds.  Musculoskeletal:     Cervical back: Neck supple.  Skin:    General: Skin is warm and dry.  Neurological:     Mental Status: She is alert and oriented to person, place, and time.  Psychiatric:        Mood and Affect: Mood normal.           Assessment & Plan:  Controlled type 2 diabetes mellitus with hyperglycemia, without long-term current use of insulin (HCC) Assessment & Plan: Controlled and improved with A1C of 5.6 today.  Increase Ozempic to 2 mg [place] weekly.   Discussed continuing to work on diet and exercise to promote weight loss.   Follow up in 6 months.   I evaluated patient, was consulted regarding treatment, and agree with assessment and plan per Tenna Delaine, RN, DNP student.   Mayra Reel, NP-C   Orders: -     POCT glycosylated hemoglobin (Hb A1C) -     Semaglutide  (2 MG/DOSE); Inject 2 mg as directed once a week. For diabetes.  Dispense: 9 mL; Refill: 1  Class 3 severe obesity due to excess calories with serious comorbidity and body mass index (BMI) of 45.0 to 49.9 in adult [facility]) Assessment & Plan: Commended patient on 10 lb weight loss.  Increase Ozempic to 2 mg [place] weekly to promote further weight loss.  Discussed continuing to work on diet and exercise to promote weight loss.   Follow up in 6 months.   I evaluated patient, was consulted regarding treatment, and agree with assessment and plan per Tenna Delaine, RN, DNP student.   Mayra Reel, NP-C          Doreene Nest, NP

## 2023-04-19 NOTE — Progress Notes (Signed)
Established Patient Office Visit  Subjective   Patient ID: Susan Morales, female    DOB: [birth year]  Age: 52 y.o. MRN: [id number]  Chief Complaint  Patient presents with   Medical Management of Chronic Issues    HPI  Susan Morales is a very pleasant 52 y.o. female who presents today for follow up of obesity.   Currently managed on Semaglutide (Ozempic) 1 mg [place] weekly.   Her last A1c was 6.1 on 01/09/2023. Today her A1c is 5.6.   Her Ozempic was increased to 1 mg  weekly 1 month ago. She has not missed any doses. She does note some extra burping and reflux symptoms if she eats after 6 pm. She takes 40 mg Prilosec at night for this a few nights per week. If she eats at 4 or 5 pm, she feels much better. She has lost 10 lb since her last office visit. The weight loss seems to help her knee pain.   Diet: She eats eggs and an [place] muffin for breakfast, will eat apples, and has a light dinner. She does not have the urge to snack.   Exercise: No regular exercise. She will ride her stationary bike occasionally and tries to walk at work.   Review of Systems  Eyes:  Negative for blurred vision and double vision.  Respiratory:  Negative for cough and shortness of breath.   Cardiovascular:  Negative for chest pain.  Gastrointestinal:  Negative for abdominal pain, diarrhea, nausea and vomiting.  Musculoskeletal:  Positive for joint pain.       Chronic knee pain  Neurological:  Negative for dizziness, tingling, sensory change, weakness and headaches.  Psychiatric/Behavioral:  Negative for depression. The patient is not nervous/anxious.      Objective:     BP 134/82   Pulse 94   Temp (!) 97.2 F (36.2 C) (Temporal)   Ht 5' 3.75" (1.619 m)   Wt 276 lb (125.2 kg)   LMP 08/11/2018   SpO2 98%   BMI 47.75 kg/m   BP Readings from Last 3 Encounters:  04/19/23 134/82  01/09/23 124/86  07/27/22 122/76   Wt Readings from Last 3 Encounters:  04/19/23 276 lb (125.2 kg)  01/09/23  286 lb 6.4 oz (129.9 kg)  07/27/22 290 lb (131.5 kg)     Physical Exam Cardiovascular:     Rate and Rhythm: Normal rate and regular rhythm.  Pulmonary:     Effort: Pulmonary effort is normal.     Breath sounds: Normal breath sounds.  Abdominal:     General: Bowel sounds are normal.  Skin:    General: Skin is warm and dry.  Neurological:     General: No focal deficit present.     Mental Status: She is alert.  Psychiatric:        Mood and Affect: Mood normal.        Behavior: Behavior normal.     Results for orders placed or performed in visit on 04/19/23  POCT glycosylated hemoglobin (Hb A1C)  Result Value Ref Range   Hemoglobin A1C 5.6 4.0 - 5.6 %   HbA1c POC (<> result, manual entry)     HbA1c, POC (prediabetic range)     HbA1c, POC (controlled diabetic range)        The 10-year ASCVD risk score (Arnett DK, et al., 2019) is: 4%    Assessment & Plan:   Problem List Items Addressed This Visit       Endocrine  Controlled type 2 diabetes mellitus (HCC) - Primary    Controlled.  Increase Ozempic to 2 mg [place] weekly.   Discussed continuing to work on diet and exercise to promote weight loss.   Follow up in 6 months.       Relevant Medications   Semaglutide, 2 MG/DOSE, 8 MG/3ML SOPN   Other Relevant Orders   POCT glycosylated hemoglobin (Hb A1C) (Completed)     Other   Class 3 severe obesity due to excess calories with body mass index (BMI) of 45.0 to 49.9 in adult [facility])    Commended patient on 10 lb weight loss.  Increase Ozempic to 2 mg [place] weekly.  Discussed continuing to work on diet and exercise to promote weight loss.   Follow up in 6 months.       Relevant Medications   Semaglutide, 2 MG/DOSE, 8 MG/3ML SOPN    No follow-ups on file.    Benito Mccreedy, RN

## 2023-05-22 NOTE — Telephone Encounter (Signed)
Can we move patient's daughter to the 10:20 am slot for Tuesday 11/26? Mom is aware.

## 2023-05-22 NOTE — Telephone Encounter (Signed)
Appointment has been moved

## 2023-06-07 ENCOUNTER — Ambulatory Visit: Payer: Managed Care, Other (non HMO) | Admitting: Family Medicine

## 2023-06-07 ENCOUNTER — Encounter: Payer: Self-pay | Admitting: Family Medicine

## 2023-06-07 VITALS — BP 164/92 | HR 95 | Temp 98.8°F | Ht 63.75 in | Wt 265.4 lb

## 2023-06-07 DIAGNOSIS — I10 Essential (primary) hypertension: Secondary | ICD-10-CM | POA: Insufficient documentation

## 2023-06-07 DIAGNOSIS — H6992 Unspecified Eustachian tube disorder, left ear: Secondary | ICD-10-CM

## 2023-06-07 DIAGNOSIS — R030 Elevated blood-pressure reading, without diagnosis of hypertension: Secondary | ICD-10-CM | POA: Diagnosis not present

## 2023-06-07 NOTE — Patient Instructions (Signed)
Start lisinopril 5 mg daily for 3-5 days... if BP trending down , you can hold the medication and follow BP off lisinopril.  Stay away from decongestants.  Keep working healthy lifestyle.  If ear pressure remaining after BP lower.. flonase 2 sprays per nostril daily.

## 2023-06-07 NOTE — Progress Notes (Signed)
Patient ID: Susan Morales, female    DOB: 1971-05-06, 52 y.o.   MRN: [id number]  This visit was conducted in person.  BP (!) 164/92   Pulse 95   Temp 98.8 F (37.1 C) (Oral)   Ht 5' 3.75" (1.619 m)   Wt 265 lb 6.4 oz (120.4 kg)   LMP 08/11/2018   SpO2 97%   BMI 45.91 kg/m    CC:  Chief Complaint  Patient presents with   Follow-up    BP     Subjective:   HPI: Susan Morales is a 52 y.o. female  patietn of Mayra Reel, NP presenting on 06/07/2023 for Follow-up (BP )  Reviewed Urgent Care OV note from 06/07/2023  BP 162/112 Recent sinus infection, now ... improved with antibiotics ( omnicef took 4 days) and prednisone.  Has been feeling tired, at home BP elevated. EKG unremarkable Started on lisinopril 5 mg daily.... has not taken yet     She has been using Dayquil ( last took 12/10), prescription cough syrup. BP Readings from Last 3 Encounters:  06/07/23 (!) 164/92  04/19/23 134/82  01/09/23 124/86    Now left ear pain and fatigue.  No HA, no CP, no SOB. No fever. No neuro changes   She is adopted.  No personal history of HTN.  Has some caffeine one soda a day, no change in stress or salt.  She denies anxiety.   She is losing weight with ozempic. Wt Readings from Last 3 Encounters:  06/07/23 265 lb 6.4 oz (120.4 kg)  04/19/23 276 lb (125.2 kg)  01/09/23 286 lb 6.4 oz (129.9 kg)     Relevant past medical, surgical, family and social history reviewed and updated as indicated. Interim medical history since our last visit reviewed. Allergies and medications reviewed and updated. Outpatient Medications Prior to Visit  Medication Sig Dispense Refill   albuterol (VENTOLIN HFA) 108 (90 Base) MCG/ACT inhaler Inhale 1-2 puffs into the lungs every 6 (six) hours as needed for wheezing or shortness of breath.     cetirizine (ZYRTEC) 10 MG tablet Take 30 mg by mouth daily.      FLUoxetine (PROZAC) 20 MG tablet TAKE 2 TABLETS BY MOUTH ONCE DAILY FOR ANXIETY. 180  tablet 1   omeprazole (PRILOSEC) 10 MG capsule Take 10 mg by mouth daily.     Probiotic Product (PROBIOTIC DAILY PO) Take by mouth.     Semaglutide, 2 MG/DOSE, 8 MG/3ML SOPN Inject 2 mg as directed once a week. For diabetes. 9 mL 1   APPLE CIDER VINEGAR PO Take by mouth.     diclofenac (VOLTAREN) 75 MG EC tablet Take 75 mg by mouth 2 (two) times daily as needed.     ESTERIFIED ESTROGENS PO Take by mouth.     hydrOXYzine (ATARAX/VISTARIL) 10 MG tablet Take 1-2 tablets (10-20 mg total) by mouth 2 (two) times daily as needed for anxiety. 30 tablet 0   naproxen (NAPROSYN) 500 MG tablet Take by mouth as needed. (Patient not taking: Reported on 04/19/2023)     No facility-administered medications prior to visit.     Per HPI unless specifically indicated in ROS section below Review of Systems  Constitutional:  Negative for fatigue and fever.  HENT:  Positive for ear pain and sinus pressure. Negative for sinus pain.   Eyes:  Negative for pain.  Respiratory:  Negative for chest tightness and shortness of breath.   Cardiovascular:  Negative for chest pain, palpitations and  leg swelling.  Gastrointestinal:  Negative for abdominal pain.  Genitourinary:  Negative for dysuria.   Objective:  BP (!) 164/92   Pulse 95   Temp 98.8 F (37.1 C) (Oral)   Ht 5' 3.75" (1.619 m)   Wt 265 lb 6.4 oz (120.4 kg)   LMP 08/11/2018   SpO2 97%   BMI 45.91 kg/m   Wt Readings from Last 3 Encounters:  06/07/23 265 lb 6.4 oz (120.4 kg)  04/19/23 276 lb (125.2 kg)  01/09/23 286 lb 6.4 oz (129.9 kg)      Physical Exam Constitutional:      General: She is not in acute distress.    Appearance: Normal appearance. She is well-developed. She is not ill-appearing or toxic-appearing.  HENT:     Head: Normocephalic.     Right Ear: Hearing, ear canal and external ear normal. A middle ear effusion is present. Tympanic membrane is not erythematous, retracted or bulging.     Left Ear: Hearing, ear canal and external  ear normal. A middle ear effusion is present. Tympanic membrane is not erythematous, retracted or bulging.     Nose: No mucosal edema or rhinorrhea.     Right Sinus: No maxillary sinus tenderness or frontal sinus tenderness.     Left Sinus: No maxillary sinus tenderness or frontal sinus tenderness.     Mouth/Throat:     Mouth: Oropharynx is clear and moist and mucous membranes are normal.     Pharynx: Uvula midline.  Eyes:     General: Lids are normal. Lids are everted, no foreign bodies appreciated.     Extraocular Movements: EOM normal.     Conjunctiva/sclera: Conjunctivae normal.     Pupils: Pupils are equal, round, and reactive to light.  Neck:     Thyroid: No thyroid mass or thyromegaly.     Vascular: No carotid bruit.     Trachea: Trachea normal.  Cardiovascular:     Rate and Rhythm: Normal rate and regular rhythm.     Pulses: Normal pulses.     Heart sounds: Normal heart sounds, S1 normal and S2 normal. No murmur heard.    No friction rub. No gallop.  Pulmonary:     Effort: Pulmonary effort is normal. No tachypnea or respiratory distress.     Breath sounds: Normal breath sounds. No decreased breath sounds, wheezing, rhonchi or rales.  Abdominal:     General: Bowel sounds are normal.     Palpations: Abdomen is soft.     Tenderness: There is no abdominal tenderness.  Musculoskeletal:     Cervical back: Normal range of motion and neck supple.  Skin:    General: Skin is warm, dry and intact.     Findings: No rash.  Neurological:     Mental Status: She is alert.  Psychiatric:        Mood and Affect: Mood is not anxious or depressed.        Speech: Speech normal.        Behavior: Behavior normal. Behavior is cooperative.        Thought Content: Thought content normal.        Cognition and Memory: Cognition and memory normal.        Judgment: Judgment normal.       Results for orders placed or performed in visit on 04/19/23  POCT glycosylated hemoglobin (Hb A1C)  Result  Value Ref Range   Hemoglobin A1C 5.6 4.0 - 5.6 %   HbA1c POC (<>  result, manual entry)     HbA1c, POC (prediabetic range)     HbA1c, POC (controlled diabetic range)      Assessment and Plan  Elevated BP without diagnosis of hypertension Assessment & Plan: Acute, no past history of high blood pressure, unknown family history.  She has been losing weight on Ozempic.  Heart healthy diet. Most likely acute elevation of blood pressure secondary to decongestant versus oral prednisone.  She is now off these medications and blood pressure seems to be coming down somewhat. Encouraged her to start lisinopril 5 mg p.o. daily given to her by urgent care at least for the next few days.  If blood pressure is coming down she can hold this medication and recheck blood pressure off medication once decongestants out of her system. Encouraged her to follow-up in 2 weeks with PCP to make sure this is not a new diagnosis of hypertension.   Dysfunction of left eustachian tube Assessment & Plan: Acute, no clear sign of viral or bacterial infection at this time. Fluid behind the eardrum somewhat bothersome but head pressure may be in part due to elevated blood pressure.  If ear pressure continuing once blood pressure normalized can try trial of Flonase 2 sprays per nostril daily as I doubt this would give side effect of elevated blood pressure.     Return in about 2 weeks (around 06/21/2023) for  Follow up with Jae Dire.   Kerby Nora, MD

## 2023-06-07 NOTE — Telephone Encounter (Signed)
Please triage

## 2023-06-07 NOTE — Telephone Encounter (Signed)
Agree with disposition of being re-evaluated with symptoms and change in condition

## 2023-06-07 NOTE — Telephone Encounter (Signed)
I spoke with pt; pt said she does not feel like herself and pt said her head feels funny; pt could not describe in more detail how she feels now. Pt said after being seen this morning pt went to work and BP was ele vated and pt intermittently sweating and feeling clammy for no reason. Pt BP at 3pm was 160/90 P 125-130. Now BP 168/98 P 84. Pt said started lisinopril 5 mg after seeing Dr Ermalene Searing this morning. Pt said she does not feel like herself. No CP,SOB and noi dizziness or vision changes. Pt ? H/A.pt is going to UC near her for eval and pt will call to update on 06/08/23. Sending note to Dr Ermalene Searing who is out of office and Audria Nine NP who is in office. Will teams Lupita Leash CMA.

## 2023-06-07 NOTE — Assessment & Plan Note (Signed)
Acute, no clear sign of viral or bacterial infection at this time. Fluid behind the eardrum somewhat bothersome but head pressure may be in part due to elevated blood pressure.  If ear pressure continuing once blood pressure normalized can try trial of Flonase 2 sprays per nostril daily as I doubt this would give side effect of elevated blood pressure.

## 2023-06-07 NOTE — Telephone Encounter (Signed)
Pt called in stating she saw Dr. Ermalene Searing today for BP. Pt states since the appt, her pulse has been in the 120s & her BP has been elevated, more elevated since seen at [facility] & her ov today. Pt states she doesn't feel like herself. Pt asked if labs can be ordered for her to see what's going on? Call back # [phone].

## 2023-06-07 NOTE — Assessment & Plan Note (Signed)
Acute, no past history of high blood pressure, unknown family history.  She has been losing weight on Ozempic.  Heart healthy diet. Most likely acute elevation of blood pressure secondary to decongestant versus oral prednisone.  She is now off these medications and blood pressure seems to be coming down somewhat. Encouraged her to start lisinopril 5 mg p.o. daily given to her by urgent care at least for the next few days.  If blood pressure is coming down she can hold this medication and recheck blood pressure off medication once decongestants out of her system. Encouraged her to follow-up in 2 weeks with PCP to make sure this is not a new diagnosis of hypertension.

## 2023-06-08 NOTE — Telephone Encounter (Signed)
Noted  

## 2023-06-23 ENCOUNTER — Encounter: Payer: Self-pay | Admitting: Primary Care

## 2023-06-23 ENCOUNTER — Ambulatory Visit: Payer: Managed Care, Other (non HMO) | Admitting: Primary Care

## 2023-06-23 VITALS — BP 134/88 | HR 85 | Temp 98.4°F | Ht 63.75 in | Wt 268.0 lb

## 2023-06-23 DIAGNOSIS — R0789 Other chest pain: Secondary | ICD-10-CM | POA: Diagnosis not present

## 2023-06-23 DIAGNOSIS — E1165 Type 2 diabetes mellitus with hyperglycemia: Secondary | ICD-10-CM | POA: Diagnosis not present

## 2023-06-23 DIAGNOSIS — R030 Elevated blood-pressure reading, without diagnosis of hypertension: Secondary | ICD-10-CM

## 2023-06-23 DIAGNOSIS — E785 Hyperlipidemia, unspecified: Secondary | ICD-10-CM | POA: Diagnosis not present

## 2023-06-23 LAB — LIPID PANEL
Cholesterol: 252 mg/dL — ABNORMAL HIGH (ref 0–200)
HDL: 61.1 mg/dL (ref >39.00)
LDL Cholesterol: 158 mg/dL — ABNORMAL HIGH (ref 0–99)
NonHDL: 190.44
Total CHOL/HDL Ratio: 4
Triglycerides: 164 mg/dL — ABNORMAL HIGH (ref 0.0–149.0)
VLDL: 32.8 mg/dL (ref 0.0–40.0)

## 2023-06-23 LAB — BASIC METABOLIC PANEL
BUN: 11 mg/dL (ref 6–23)
CO2: 29 meq/L (ref 19–32)
Calcium: 8.9 mg/dL (ref 8.4–10.5)
Chloride: 101 meq/L (ref 96–112)
Creatinine, Ser: 0.82 mg/dL (ref 0.40–1.20)
GFR: 81.49 mL/min (ref >60.00)
Glucose, Bld: 109 mg/dL — ABNORMAL HIGH (ref 70–99)
Potassium: 3.7 meq/L (ref 3.5–5.1)
Sodium: 137 meq/L (ref 135–145)

## 2023-06-23 NOTE — Assessment & Plan Note (Signed)
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 She declined statin therapy at the time, is open now.  Repeat lipid panel pending. Would initiate high intensity statin. Await results.

## 2023-06-23 NOTE — Progress Notes (Signed)
Subjective:    Patient ID: Susan Morales, female    DOB: 1971/01/11, 51 y.o.   MRN: [id number]  HPI  Susan Morales is a very pleasant 52 y.o. female with a history of OSA, GERD, type 2 diabetes, fibromyalgia, chornic cough who presents today for urgent care follow up.  Evaluated at Urgent Care through [facility] on 05/28/23, 06/06/23 and 06/07/23 for symptoms of sinusitis, elevated blood pressure readings, and fatigue. During those visits her BP was 160/105, 162/111, and 142/88.   She was treated for sinusitis on 05/28/23 with prednisone, cefdinir 300 mg BID x 10 days, and Phenergan DM. She was initiated on lisinopril 5 mg once daily on 06/06/23. She was treated for acute cystitis with Macrobid course on 06/07/23.   Since her three urgent care visits she completed her Macrobid course. She visited her OB/GYN this week who initiated ciprofloxacin 500 mg BID for continued UTI. She is checking her BP at home which is running in the 160-170/100. She has been taking lisinopril inconsistently. She did take a dose of lisinopril 5 mg two days ago and yesterday.   She was told at urgent care that she may have had a prior heart attack based on ECG results. She does have intermittent left chest tightness which began about 2 weeks ago.   She stopped Ozempic 2 weeks ago due to her symptoms. She continues to notice intermittent tachycardia with HR ranging up to 148. Two days ago she was notified that her pulse was over 100 at rest for about 10 minutes.   She does not know much of her family history as she is adopted. She underwent lipid panel in July 2024 which showed LDL of 205. She declined statin treatment the time, is open now given her symptoms.   BP Readings from Last 3 Encounters:  06/23/23 134/88  06/07/23 (!) 164/92  04/19/23 134/82   Wt Readings from Last 3 Encounters:  06/23/23 268 lb (121.6 kg)  06/07/23 265 lb 6.4 oz (120.4 kg)  04/19/23 276 lb (125.2 kg)      Review of  Systems  Constitutional:  Negative for fever.  Respiratory:  Positive for chest tightness. Negative for shortness of breath.   Cardiovascular:  Positive for chest pain and palpitations.  Genitourinary:  Negative for dysuria, frequency and pelvic pain.         Past Medical History:  Diagnosis Date   Adenomyosis 12/17/2018   Allergy    Complication of anesthesia    Deviate septum, combative   COVID-19 virus infection 03/06/2020   Depression    GERD (gastroesophageal reflux disease)    Hemorrhoid    Pelvic pain 12/17/2018   UTI (urinary tract infection)     Social History   Socioeconomic History   Marital status: Married    Spouse name: Not on file   Number of children: Not on file   Years of education: Not on file   Highest education level: Not on file  Occupational History   Not on file  Tobacco Use   Smoking status: Never   Smokeless tobacco: Never  Vaping Use   Vaping status: Never Used  Substance and Sexual Activity   Alcohol use: No    Alcohol/week: 0.0 standard drinks of alcohol   Drug use: No   Sexual activity: Yes  Other Topics Concern   Not on file  Social History Narrative   Married.   4 children.   Works as a Manufacturing systems engineer.  Enjoys reading, spending time with her family.   Social Drivers of Corporate investment banker Strain: Not on file  Food Insecurity: Not on file  Transportation Needs: Not on file  Physical Activity: Not on file  Stress: Not on file  Social Connections: Not on file  Intimate Partner Violence: Not on file    Past Surgical History:  Procedure Laterality Date   ABLATION  2015   DIRECT LARYNGOSCOPY N/A 01/29/2013   Procedure: DIRECT LARYNGOSCOPY, esophagoscopy, bronchoscopy, ;  Surgeon: Melvenia Beam, MD;  Location: [facility];  Service: ENT;  Laterality: N/A;   LAPAROSCOPIC VAGINAL HYSTERECTOMY WITH SALPINGECTOMY Bilateral 12/17/2018   Procedure: LAPAROSCOPIC ASSISTED VAGINAL HYSTERECTOMY WITH SALPINGECTOMY;  Surgeon:  Marcelle Overlie, MD;  Location: [facility];  Service: Gynecology;  Laterality: Bilateral;   UPPER GASTROINTESTINAL ENDOSCOPY      Family History  Adopted: Yes  Problem Relation Age of Onset   Breast cancer Mother        in 74's   Breast cancer Paternal Grandmother    Colon cancer Neg Hx     No Known Allergies  Current Outpatient Medications on File Prior to Visit  Medication Sig Dispense Refill   albuterol (VENTOLIN HFA) 108 (90 Base) MCG/ACT inhaler Inhale 1-2 puffs into the lungs every 6 (six) hours as needed for wheezing or shortness of breath.     cetirizine (ZYRTEC) 10 MG tablet Take 30 mg by mouth daily.      ciprofloxacin (CIPRO) 500 MG tablet Take 500 mg by mouth 2 (two) times daily.     FLUoxetine (PROZAC) 20 MG tablet TAKE 2 TABLETS BY MOUTH ONCE DAILY FOR ANXIETY. 180 tablet 1   lisinopril (ZESTRIL) 5 MG tablet Take 5 mg by mouth daily.     omeprazole (PRILOSEC) 10 MG capsule Take 10 mg by mouth daily.     Probiotic Product (PROBIOTIC DAILY PO) Take by mouth.     Semaglutide, 2 MG/DOSE, 8 MG/3ML SOPN Inject 2 mg as directed once a week. For diabetes. 9 mL 1   No current facility-administered medications on file prior to visit.    BP 134/88   Pulse 85   Temp 98.4 F (36.9 C) (Temporal)   Ht 5' 3.75" (1.619 m)   Wt 268 lb (121.6 kg)   LMP 08/11/2018   SpO2 98%   BMI 46.36 kg/m  Objective:   Physical Exam Cardiovascular:     Rate and Rhythm: Normal rate and regular rhythm.  Pulmonary:     Effort: Pulmonary effort is normal.     Breath sounds: Normal breath sounds.  Musculoskeletal:     Cervical back: Neck supple.  Skin:    General: Skin is warm and dry.  Neurological:     Mental Status: She is alert and oriented to person, place, and time.  Psychiatric:        Mood and Affect: Mood normal.           Assessment & Plan:  Hyperlipidemia, unspecified hyperlipidemia type Assessment & Plan: Familial based on lipid panel result from  July 2024. She declined statin therapy at the time, is open now.  Repeat lipid panel pending. Would initiate high intensity statin. Await results.   Orders: -     Ambulatory referral to Cardiology -     Lipid panel -     CT CARDIAC SCORING (SELF PAY ONLY); Future  Chest tightness Assessment & Plan: Reviewed ECG from [facility]gent [facility]. Also compared to  ECG from 2022. Both appear similar.  Given multiple co-morbidities, coupled with symptoms and labs, will refer to cardiology for evaluation. CT coronary scan ordered and pending.    Orders: -     Ambulatory referral to Cardiology -     Basic metabolic panel -     CT CARDIAC SCORING (SELF PAY ONLY); Future  Controlled type 2 diabetes mellitus with hyperglycemia, without long-term current use of insulin (HCC)  Elevated BP without diagnosis of hypertension Assessment & Plan: Improved.  Continue lisinopril 5 mg daily. Follow up in 2 weeks.          Doreene Nest, NP

## 2023-06-23 NOTE — Assessment & Plan Note (Signed)
Reviewed ECG from [facility]. Also compared to ECG from 2022. Both appear similar.  Given multiple co-morbidities, coupled with symptoms and labs, will refer to cardiology for evaluation. CT coronary scan ordered and pending.

## 2023-06-23 NOTE — Assessment & Plan Note (Signed)
Improved.  Continue lisinopril 5 mg daily. Follow up in 2 weeks.

## 2023-06-23 NOTE — Patient Instructions (Signed)
Stop by the lab prior to leaving today. I will notify you of your results once received.   You will either be contacted via phone regarding your referral to cardiology, or you may receive a letter on your MyChart portal from our referral team with instructions for scheduling an appointment. Please let us know if you have not been contacted by anyone within two weeks.  You will receive a phone call regarding your CT scan.  Continue taking lisinopril 5 mg once daily.  Please schedule a follow up visit to meet back with me in 2 weeks for blood pressure check.   It was a pleasure to see you today!

## 2023-06-30 ENCOUNTER — Other Ambulatory Visit (INDEPENDENT_AMBULATORY_CARE_PROVIDER_SITE_OTHER): Payer: Managed Care, Other (non HMO)

## 2023-06-30 ENCOUNTER — Telehealth: Payer: Self-pay | Admitting: *Deleted

## 2023-06-30 DIAGNOSIS — R300 Dysuria: Secondary | ICD-10-CM

## 2023-06-30 LAB — POC URINALSYSI DIPSTICK (AUTOMATED)
Bilirubin, UA: NEGATIVE
Blood, UA: NEGATIVE
Glucose, UA: NEGATIVE
Ketones, UA: NEGATIVE
Leukocytes, UA: NEGATIVE
Nitrite, UA: NEGATIVE
Protein, UA: NEGATIVE
Spec Grav, UA: 1.015 (ref 1.010–1.025)
Urobilinogen, UA: 0.2 U/dL — AB
pH, UA: 6 (ref 5.0–8.0)

## 2023-06-30 NOTE — Addendum Note (Signed)
 Addended by: Doreene Nest on: 06/30/2023 01:13 PM   Modules accepted: Orders

## 2023-06-30 NOTE — Telephone Encounter (Addendum)
 What are her symptoms?  Please schedule a lab appointment for her to come in for point-of-care urinalysis and urine culture.

## 2023-06-30 NOTE — Telephone Encounter (Signed)
 Called and spoke with patient she is still having burning with urination and some stomach discomfort as well. Patient coming in this afternoon to give urine sample.

## 2023-06-30 NOTE — Telephone Encounter (Signed)
 Noted. Orders placed.

## 2023-06-30 NOTE — Telephone Encounter (Signed)
 Copied from CRM [id number]. Topic: Clinical - Request for Lab/Test Order >> Jun 30, 2023  9:55 AM [name] wrote: Reason for CRM: Patient called says she still thinks she has a UTI and would like a order put in to get tested

## 2023-07-02 LAB — URINE CULTURE
MICRO NUMBER:: 15915061
SPECIMEN QUALITY:: ADEQUATE

## 2023-07-10 ENCOUNTER — Ambulatory Visit (HOSPITAL_COMMUNITY)
Admission: RE | Admit: 2023-07-10 | Discharge: 2023-07-10 | Disposition: A | Discharge status: Home / Self Care | Payer: Self-pay | Source: Ambulatory Visit | Attending: Primary Care | Admitting: Primary Care

## 2023-07-10 DIAGNOSIS — R0789 Other chest pain: Secondary | ICD-10-CM | POA: Insufficient documentation

## 2023-07-10 DIAGNOSIS — E785 Hyperlipidemia, unspecified: Secondary | ICD-10-CM | POA: Insufficient documentation

## 2023-07-11 ENCOUNTER — Ambulatory Visit: Payer: Managed Care, Other (non HMO) | Admitting: Primary Care

## 2023-07-20 ENCOUNTER — Ambulatory Visit: Payer: Managed Care, Other (non HMO) | Admitting: Primary Care

## 2023-08-04 ENCOUNTER — Ambulatory Visit (INDEPENDENT_AMBULATORY_CARE_PROVIDER_SITE_OTHER)
Admission: RE | Admit: 2023-08-04 | Discharge: 2023-08-04 | Disposition: A | Discharge status: Home / Self Care | Payer: Managed Care, Other (non HMO) | Source: Ambulatory Visit | Attending: Primary Care

## 2023-08-04 ENCOUNTER — Encounter: Payer: Self-pay | Admitting: Primary Care

## 2023-08-04 ENCOUNTER — Other Ambulatory Visit: Payer: Self-pay | Admitting: Primary Care

## 2023-08-04 ENCOUNTER — Ambulatory Visit: Payer: Managed Care, Other (non HMO) | Admitting: Primary Care

## 2023-08-04 VITALS — BP 128/84 | HR 105 | Temp 97.1°F | Ht 63.75 in

## 2023-08-04 DIAGNOSIS — R053 Chronic cough: Secondary | ICD-10-CM

## 2023-08-04 DIAGNOSIS — R030 Elevated blood-pressure reading, without diagnosis of hypertension: Secondary | ICD-10-CM | POA: Diagnosis not present

## 2023-08-04 DIAGNOSIS — J4530 Mild persistent asthma, uncomplicated: Secondary | ICD-10-CM

## 2023-08-04 MED ORDER — PREDNISONE 20 MG PO TABS
ORAL_TABLET | ORAL | 0 refills | Status: DC
Start: 2023-08-04 — End: 2023-09-07

## 2023-08-04 MED ORDER — QVAR REDIHALER 80 MCG/ACT IN AERB: 2.0000 | INHALATION_SPRAY | Freq: Two times a day (BID) | RESPIRATORY_TRACT | 0 refills | Status: DC

## 2023-08-04 NOTE — Assessment & Plan Note (Signed)
 Differentials include untreated asthma, pneumonia, allergies, reflux induced cough.  No longer on ACE inhibitor.  X-ray ordered and pending today given today's exam. Start prednisone  tablets. Take two tablets my mouth once daily in the morning for four days, then one tablet once daily in the morning for four days.   Would likely need stepwise therapy for asthma, inhaled corticosteroid versus ICS/LABA treatment. Await x-ray results.

## 2023-08-04 NOTE — Assessment & Plan Note (Signed)
 Blood pressure controlled without hypertensive treatment. Remain off lisinopril.

## 2023-08-04 NOTE — Progress Notes (Signed)
 Subjective:    Patient ID: Susan Morales, female    DOB: 11/10/1970, 53 y.o.   MRN: [id number]  Cough Associated symptoms include a fever and wheezing. Pertinent negatives include no chills, ear pain or sore throat.    Susan Morales is a very pleasant 53 y.o. female with a history of sleep apnea, type 2 diabetes, of myalgia, chronic cough, elevated blood pressure readings, hyperlipidemia who presents today to discuss cough and follow-up of hypertension.  1) Hypertension: Previously managed on lisinopril 5 mg daily which was initiated in December 2024 per urgent care.  She took lisinopril for a few weeks then discontinued several weeks ago.   She is checking her BP at home which is running 130-140/70-80.   BP Readings from Last 3 Encounters:  08/04/23 128/84  06/23/23 134/88  06/07/23 (!) 164/92    2) Persistent Cough: One week ago she developed a sore throat, lasted for 7 days. She purchased some Nasacort  and sore throat resolved.   Her cough began in December 2024, mostly wet then, but over the last few weeks her cough is dry. Her occurs during the day with an irritation to her throat. She has noticed low grade fever today. During the night she hears congestion and feels fluid in her chest.  Evaluated by pulmonology previously, was told she had mild asthma.  She has been using her albuterol inhaler recently without improvement in her cough.  She works around [employer], has been exposed to the stomach bug and flu. Overall she feels well, not sickly.   -Treated for sinusitis at urgent care through [facility] on 05/28/2023 with prednisone , cefdinir 300 mg twice daily x 10 days.  -Initiated on lisinopril 5 mg daily for urgent care through [facility] on 06/06/2023.  -Treated for acute cystitis urgent care through [facility] on 06/07/2023 with Macrobid course.  -Treated by OB/GYN one week later for continued UTI, doesn't recall the name of the  antibiotic.  -Evaluated urgent care on 07/11/2023, for URI symptoms, treated with cefdinir 300 mg twice daily x 10 days, cough syrup, albuterol inhaler.  -Evaluated urgent care again on 07/15/2023 for URI symptoms, treated with prednisone  40 mg daily x 5 days, codeine/guaifenesin. Chest x-ray was completed which showed mild peribronchial thickening and perihilar atelectasis suggesting viral/inflammatory small airway disease.  No pneumonia.   Review of Systems  Constitutional:  Positive for fever. Negative for chills.  HENT:  Positive for congestion. Negative for ear pain, sinus pressure, sneezing and sore throat.   Respiratory:  Positive for cough, chest tightness and wheezing.          Past Medical History:  Diagnosis Date   Adenomyosis 12/17/2018   Allergy    Complication of anesthesia    Deviate septum, combative   COVID-19 virus infection 03/06/2020   Depression    GERD (gastroesophageal reflux disease)    Hemorrhoid    Pelvic pain 12/17/2018   UTI (urinary tract infection)     Social History   Socioeconomic History   Marital status: Married    Spouse name: Not on file   Number of children: Not on file   Years of education: Not on file   Highest education level: Not on file  Occupational History   Not on file  Tobacco Use   Smoking status: Never   Smokeless tobacco: Never  Vaping Use   Vaping status: Never Used  Substance and Sexual Activity   Alcohol use: No    Alcohol/week:  0.0 standard drinks of alcohol   Drug use: No   Sexual activity: Yes  Other Topics Concern   Not on file  Social History Narrative   Married.   4 children.   Works as a manufacturing systems engineer.   Enjoys reading, spending time with her family.   Social Drivers of Corporate Investment Banker Strain: Not on file  Food Insecurity: Not on file  Transportation Needs: Not on file  Physical Activity: Not on file  Stress: Not on file  Social Connections: Not on file  Intimate Partner Violence:  Not on file    Past Surgical History:  Procedure Laterality Date   ABLATION  2015   DIRECT LARYNGOSCOPY N/A 01/29/2013   Procedure: DIRECT LARYNGOSCOPY, esophagoscopy, bronchoscopy, ;  Surgeon: Merilee Kraft, MD;  Location: [facility];  Service: ENT;  Laterality: N/A;   LAPAROSCOPIC VAGINAL HYSTERECTOMY WITH SALPINGECTOMY Bilateral 12/17/2018   Procedure: LAPAROSCOPIC ASSISTED VAGINAL HYSTERECTOMY WITH SALPINGECTOMY;  Surgeon: Mat Browning, MD;  Location: [facility] [place];  Service: Gynecology;  Laterality: Bilateral;   UPPER GASTROINTESTINAL ENDOSCOPY      Family History  Adopted: Yes  Problem Relation Age of Onset   Breast cancer Mother        in 66's   Breast cancer Paternal Grandmother    Colon cancer Neg Hx     No Known Allergies  Current Outpatient Medications on File Prior to Visit  Medication Sig Dispense Refill   albuterol (VENTOLIN HFA) 108 (90 Base) MCG/ACT inhaler Inhale 1-2 puffs into the lungs every 6 (six) hours as needed for wheezing or shortness of breath.     cetirizine (ZYRTEC) 10 MG tablet Take 30 mg by mouth daily.      FLUoxetine  (PROZAC ) 20 MG tablet TAKE 2 TABLETS BY MOUTH ONCE DAILY FOR ANXIETY. 180 tablet 1   omeprazole  (PRILOSEC) 10 MG capsule Take 10 mg by mouth daily.     Probiotic Product (PROBIOTIC DAILY PO) Take by mouth.     ciprofloxacin (CIPRO) 500 MG tablet Take 500 mg by mouth 2 (two) times daily. (Patient not taking: Reported on 08/04/2023)     Semaglutide , 2 MG/DOSE, 8 MG/3ML SOPN Inject 2 mg as directed once a week. For diabetes. (Patient not taking: Reported on 08/04/2023) 9 mL 1   No current facility-administered medications on file prior to visit.    BP 128/84   Pulse (!) 105   Temp (!) 97.1 F (36.2 C) (Temporal)   Ht 5' 3.75 (1.619 m)   LMP 08/11/2018   SpO2 96%   BMI 46.36 kg/m  Objective:   Physical Exam Constitutional:      Appearance: She is not ill-appearing.  HENT:     Right Ear: Tympanic membrane and ear  canal normal.     Left Ear: Tympanic membrane and ear canal normal.     Nose: No mucosal edema.     Right Sinus: No maxillary sinus tenderness or frontal sinus tenderness.     Left Sinus: No maxillary sinus tenderness or frontal sinus tenderness.     Mouth/Throat:     Mouth: Mucous membranes are moist.  Eyes:     Conjunctiva/sclera: Conjunctivae normal.  Cardiovascular:     Rate and Rhythm: Normal rate and regular rhythm.  Pulmonary:     Effort: Pulmonary effort is normal.     Breath sounds: Examination of the right-upper field reveals wheezing. Examination of the left-upper field reveals wheezing. Examination of the right-lower field reveals wheezing and rhonchi.  Examination of the left-lower field reveals wheezing and rhonchi. Wheezing and rhonchi present.  Musculoskeletal:     Cervical back: Neck supple.  Skin:    General: Skin is warm and dry.           Assessment & Plan:  Persistent cough for 3 weeks or longer Assessment & Plan: Differentials include untreated asthma, pneumonia, allergies, reflux induced cough.  No longer on ACE inhibitor.  X-ray ordered and pending today given today's exam. Start prednisone  tablets. Take two tablets my mouth once daily in the morning for four days, then one tablet once daily in the morning for four days.   Would likely need stepwise therapy for asthma, inhaled corticosteroid versus ICS/LABA treatment. Await x-ray results.  Orders: -     DG Chest 2 View -     predniSONE ; Take 2 tablets by mouth once daily in the morning for 5 days.  Dispense: 10 tablet; Refill: 0        Comer MARLA Gaskins, NP

## 2023-08-04 NOTE — Patient Instructions (Signed)
Start prednisone 20 mg tablets. Take 2 tablets by mouth once daily in the morning for 5 days.  Complete xray(s) prior to leaving today. I will notify you of your results once received.  It was a pleasure to see you today!

## 2023-09-07 ENCOUNTER — Ambulatory Visit (INDEPENDENT_AMBULATORY_CARE_PROVIDER_SITE_OTHER)

## 2023-09-07 ENCOUNTER — Encounter: Payer: Self-pay | Admitting: Podiatry

## 2023-09-07 ENCOUNTER — Ambulatory Visit: Admitting: Podiatry

## 2023-09-07 DIAGNOSIS — M79671 Pain in right foot: Secondary | ICD-10-CM

## 2023-09-07 DIAGNOSIS — M7731 Calcaneal spur, right foot: Secondary | ICD-10-CM | POA: Diagnosis not present

## 2023-09-07 DIAGNOSIS — M722 Plantar fascial fibromatosis: Secondary | ICD-10-CM

## 2023-09-07 MED ORDER — TRIAMCINOLONE ACETONIDE 10 MG/ML IJ SUSP
5.0000 mg | Freq: Once | INTRAMUSCULAR | Status: AC
  Administered 2023-09-07: 5 mg via INTRAMUSCULAR

## 2023-09-07 MED ORDER — MELOXICAM 7.5 MG PO TABS: 7.5000 mg | ORAL_TABLET | Freq: Every day | ORAL | 0 refills | Status: DC | PRN

## 2023-09-07 NOTE — Patient Instructions (Signed)

## 2023-09-07 NOTE — Progress Notes (Signed)
 Subjective:   Patient ID: Susan Morales, female   DOB: 53 y.o.   MRN: [id number]   HPI Chief Complaint  Patient presents with   Foot Pain    RM#14 right heel pain worsening in the last 2 months no injuries.   53 year old female presents the office today with above concerns.  She does not report any recent injuries.  She states she started using stairs at work more.  Time she gets pain going up the Achilles tendon most the pain to the bottom of the heel.  No radiating pain.  She tried icing, stretching as well as anti-inflammatories.   Review of Systems  All other systems reviewed and are negative.  Past Medical History:  Diagnosis Date   Adenomyosis 12/17/2018   Allergy    Complication of anesthesia    Deviate septum, combative   COVID-19 virus infection 03/06/2020   Depression    GERD (gastroesophageal reflux disease)    Hemorrhoid    Pelvic pain 12/17/2018   UTI (urinary tract infection)     Past Surgical History:  Procedure Laterality Date   ABLATION  2015   DIRECT LARYNGOSCOPY N/A 01/29/2013   Procedure: DIRECT LARYNGOSCOPY, esophagoscopy, bronchoscopy, ;  Surgeon: Melvenia Beam, MD;  Location: [facility];  Service: ENT;  Laterality: N/A;   LAPAROSCOPIC VAGINAL HYSTERECTOMY WITH SALPINGECTOMY Bilateral 12/17/2018   Procedure: LAPAROSCOPIC ASSISTED VAGINAL HYSTERECTOMY WITH SALPINGECTOMY;  Surgeon: Marcelle Overlie, MD;  Location: [facility];  Service: Gynecology;  Laterality: Bilateral;   UPPER GASTROINTESTINAL ENDOSCOPY       Current Outpatient Medications:    albuterol (VENTOLIN HFA) 108 (90 Base) MCG/ACT inhaler, Inhale 1-2 puffs into the lungs every 6 (six) hours as needed for wheezing or shortness of breath., Disp: , Rfl:    beclomethasone (QVAR REDIHALER) 80 MCG/ACT inhaler, Inhale 2 puffs into the lungs 2 (two) times daily., Disp: 1 each, Rfl: 0   cetirizine (ZYRTEC) 10 MG tablet, Take 30 mg by mouth daily. , Disp: , Rfl:    FLUoxetine (PROZAC) 20  MG tablet, TAKE 2 TABLETS BY MOUTH ONCE DAILY FOR ANXIETY., Disp: 180 tablet, Rfl: 1   meloxicam (MOBIC) 7.5 MG tablet, Take 1 tablet (7.5 mg total) by mouth daily as needed for pain., Disp: 30 tablet, Rfl: 0   omeprazole (PRILOSEC) 10 MG capsule, Take 10 mg by mouth daily., Disp: , Rfl:    Probiotic Product (PROBIOTIC DAILY PO), Take by mouth., Disp: , Rfl:    ciprofloxacin (CIPRO) 500 MG tablet, Take 500 mg by mouth 2 (two) times daily. (Patient not taking: Reported on 09/07/2023), Disp: , Rfl:    Semaglutide, 2 MG/DOSE, 8 MG/3ML SOPN, Inject 2 mg as directed once a week. For diabetes. (Patient not taking: Reported on 09/07/2023), Disp: 9 mL, Rfl: 1  No Known Allergies        Objective:  Physical Exam  General: AAO x3, NAD  Dermatological: Skin is warm, dry and supple bilateral.  There are no open sores, no preulcerative lesions, no rash or signs of infection present.  Vascular: Dorsalis Pedis artery and Posterior Tibial artery pedal pulses are 2/4 bilateral with immedate capillary fill time.  There is no pain with calf compression, swelling, warmth, erythema.   Neruologic: Grossly intact via light touch bilateral.  Negative tinel sign.  Musculoskeletal: Tenderness to palpation along the plantar medial tubercle of the calcaneus at the insertion of plantar fascia on the right foot. There is no pain along the course of the plantar  fascia within the arch of the foot. Plantar fascia appears to be intact. There is no pain with lateral compression of the calcaneus or pain with vibratory sensation. There is no pain along the course or insertion of the achilles tendon, although at times she does get discomfort along this area. No other areas of tenderness to bilateral lower extremities.  Gait: Unassisted, Nonantalgic.       Assessment:   Right heel pain, plantar fasciitis, Achilles tendinitis     Plan:  -Treatment options discussed including all alternatives, risks, and  complications -Etiology of symptoms were discussed -X-rays were obtained and reviewed with the patient.  3 views of the foot were obtained.  Calcaneal spurring present.  There is no evidence of acute fracture.  Decreased calcaneal inclination angle. -Steroid injection performed.  See procedure note below. -Prescribed mobic. Discussed side effects of the medication and directed to stop if any are to occur and call the office.  -Plantar fascia brace dispensed help support and stabilize the plantar fascia to help facilitate healing -Discussed shoes, good arch support.  Return if symptoms worsen or fail to improve.  Vivi Barrack DPM   -

## 2023-09-11 ENCOUNTER — Ambulatory Visit: Payer: Managed Care, Other (non HMO) | Attending: Cardiology | Admitting: Cardiology

## 2023-09-11 ENCOUNTER — Other Ambulatory Visit: Payer: Self-pay | Admitting: Cardiology

## 2023-09-11 ENCOUNTER — Other Ambulatory Visit: Payer: Self-pay

## 2023-09-11 ENCOUNTER — Ambulatory Visit: Attending: Cardiology

## 2023-09-11 ENCOUNTER — Encounter: Payer: Self-pay | Admitting: Cardiology

## 2023-09-11 VITALS — BP 124/84 | HR 94 | Resp 16 | Ht 63.0 in | Wt 272.4 lb

## 2023-09-11 DIAGNOSIS — R072 Precordial pain: Secondary | ICD-10-CM

## 2023-09-11 DIAGNOSIS — R002 Palpitations: Secondary | ICD-10-CM | POA: Diagnosis not present

## 2023-09-11 DIAGNOSIS — R931 Abnormal findings on diagnostic imaging of heart and coronary circulation: Secondary | ICD-10-CM

## 2023-09-11 DIAGNOSIS — R0789 Other chest pain: Secondary | ICD-10-CM

## 2023-09-11 MED ORDER — METOPROLOL TARTRATE 100 MG PO TABS: ORAL_TABLET | ORAL | 0 refills | Status: DC

## 2023-09-11 MED ORDER — ATORVASTATIN CALCIUM 20 MG PO TABS: 20.0000 mg | ORAL_TABLET | Freq: Every day | ORAL | 3 refills | Status: AC

## 2023-09-11 NOTE — Progress Notes (Unsigned)
 Enrolled for [employer] to mail a ZIO XT long term holter monitor to the patients address on file.

## 2023-09-11 NOTE — Patient Instructions (Addendum)
 Medication Instructions:  Your physician has recommended you make the following change in your medication:  1) START taking atorvastatin 20 mg once daily *If you need a refill on your cardiac medications before your next appointment, please call your pharmacy*  Lab Work: IN 6 WEEKS: Fasting lipids and ALT - you may go to any [facility] location to have these drawn  Testing/Procedures: Echocardiogram  Your physician has requested that you have an echocardiogram. Echocardiography is a painless test that uses sound waves to create images of your heart. It provides your doctor with information about the size and shape of your heart and how well your heart's chambers and valves are working. This procedure takes approximately one hour. There are no restrictions for this procedure. Please do NOT wear cologne, perfume, aftershave, or lotions (deodorant is allowed). Please arrive 15 minutes prior to your appointment time.  Coronary CTA Your physician has requested that you have cardiac CT. Cardiac computed tomography (CT) is a painless test that uses an x-ray machine to take clear, detailed pictures of your heart. For further information please visit [email or web address] Please follow instruction sheet as given.  Event Monitor Your physician has recommended that you wear an event monitor. Event monitors are medical devices that record the heart's electrical activity. Doctors most often [place] these monitors to diagnose arrhythmias. Arrhythmias are problems with the speed or rhythm of the heartbeat. The monitor is a small, portable device. You can wear one while you do your normal daily activities. This is usually used to diagnose what is causing palpitations/syncope (passing out).   Follow-Up: At [facility], you and your health needs are our priority.  As part of our continuing mission to provide you with exceptional heart care, we have created designated Provider Care Teams.  These Care Teams  include your primary Cardiologist (physician) and Advanced Practice Providers (APPs -  Physician Assistants and Nurse Practitioners) who all work together to provide you with the care you need, when you need it.  Your next appointment:   Follow up with Dr. Mayford Knife as needed based on results of testing   ZIO XT- Long Term Monitor Instructions  Your physician has requested you wear a ZIO patch monitor for 14 days.  This is a single patch monitor. [employer] supplies one patch monitor per enrollment. Additional stickers are not available. Please do not apply patch if you will be having a Nuclear Stress Test,  Echocardiogram, Cardiac CT, MRI, or Chest Xray during the period you would be wearing the  monitor. The patch cannot be worn during these tests. You cannot remove and re-apply the  ZIO XT patch monitor.  Your ZIO patch monitor will be mailed 3 day USPS to your address on file. It may take 3-5 days  to receive your monitor after you have been enrolled.  Once you have received your monitor, please review the enclosed instructions. Your monitor  has already been registered assigning a specific monitor serial # to you.  Billing and Patient Assistance Program Information  We have supplied [employer] with any of your insurance information on file for billing purposes. [employer] offers a sliding scale Patient Assistance Program for patients that do not have  insurance, or whose insurance does not completely cover the cost of the ZIO monitor.  You must apply for the Patient Assistance Program to qualify for this discounted rate.  To apply, please call [employer] at [phone], select option 4, select option 2, ask to apply for  Patient Assistance Program. Meredeth Ide  will ask your household income, and how many people  are in your household. They will quote your out-of-pocket cost based on that information.  [employer] will also be able to set up a 67-month, interest-free payment plan if needed.  Applying the  monitor   Shave hair from upper left chest.  Hold abrader disc by orange tab. Rub abrader in 40 strokes over the upper left chest as  indicated in your monitor instructions.  Clean area with 4 enclosed alcohol pads. Let dry.  Apply patch as indicated in monitor instructions. Patch will be placed under collarbone on left  side of chest with arrow pointing upward.  Rub patch adhesive wings for 2 minutes. Remove white label marked "1". Remove the white  label marked "2". Rub patch adhesive wings for 2 additional minutes.  While looking in a mirror, press and release button in center of patch. A small green light will  flash 3-4 times. This will be your only indicator that the monitor has been turned on.  Do not shower for the first 24 hours. You may shower after the first 24 hours.  Press the button if you feel a symptom. You will hear a small click. Record Date, Time and  Symptom in the Patient Logbook.  When you are ready to remove the patch, follow instructions on the last 2 pages of Patient  Logbook. Stick patch monitor onto the last page of Patient Logbook.  Place Patient Logbook in the blue and white box. Use locking tab on box and tape box closed  securely. The blue and white box has prepaid postage on it. Please place it in the mailbox as  soon as possible. Your physician should have your test results approximately 7 days after the  monitor has been mailed back to [facility].  Call [facility] Customer Care at [phone] if you have questions regarding  your ZIO XT patch monitor. Call them immediately if you see an orange light blinking on your  monitor.  If your monitor falls off in less than 4 days, contact our Monitor department at [phone].  If your monitor becomes loose or falls off after 4 days call [employer] at [phone] for  suggestions on securing your monitor

## 2023-09-11 NOTE — Progress Notes (Signed)
 Cardiology CONSULT Note    Date:  09/11/2023   ID:  Susan Morales, DOB 15-Jul-1970, MRN [id number]  PCP:  Doreene Nest, NP  Cardiologist:  Armanda Magic, MD   Chief Complaint  Patient presents with   New Patient (Initial Visit)    Chest pain and coronary calcium    Patient Profile: Susan Morales is a 53 y.o. female who is being seen today for the evaluation of Chest pain at the request of Doreene Nest, NP.  History of Present Illness:  Susan Morales is a 53 y.o. female who is being seen today for the evaluation of Chest pain at the request of Doreene Nest, NP.  This is a 53yo female with a hx of depression and GERD who recently had a coronary Ca score done 06/2023 showing a cor cal score of 10.1 which is 85th % for age, race and sex matched controls.  This was noted in the LCx only.   She tells me in December she was having palpitations but then found out she had a bad UTI and after treatment of her UTI she has not had any further palpitations. During that same time she had been having chest pain and also has panic attacks.  She has been under a lot of stress recently and feels like the elevated heart rate comes from anxiety.  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   She also has had intermittent heaviness on the left side of her chest that occurred in December but then has had it 1-2 times since then but occurs when she is anxious.  There is no radiation of the heaviness.  She has no associated sx of nausea, diaphoresis or SOB with the discomfort. The heaviness occurs 2 times monthly and usually lasts a few seconds and resolves. She is adopted and has never smoked.   Past Medical History:  Diagnosis Date   Adenomyosis 12/17/2018   Allergy    Complication of anesthesia    Deviate septum, combative   COVID-19 virus infection 03/06/2020   Depression    GERD  (gastroesophageal reflux disease)    Hemorrhoid    Pelvic pain 12/17/2018   UTI (urinary tract infection)     Past Surgical History:  Procedure Laterality Date   ABLATION  2015   DIRECT LARYNGOSCOPY N/A 01/29/2013   Procedure: DIRECT LARYNGOSCOPY, esophagoscopy, bronchoscopy, ;  Surgeon: Melvenia Beam, MD;  Location: [facility];  Service: ENT;  Laterality: N/A;   LAPAROSCOPIC VAGINAL HYSTERECTOMY WITH SALPINGECTOMY Bilateral 12/17/2018   Procedure: LAPAROSCOPIC ASSISTED VAGINAL HYSTERECTOMY WITH SALPINGECTOMY;  Surgeon: Marcelle Overlie, MD;  Location: [facility] [place];  Service: Gynecology;  Laterality: Bilateral;   UPPER GASTROINTESTINAL ENDOSCOPY      Current Medications: Current Meds  Medication Sig   albuterol (VENTOLIN HFA) 108 (90 Base) MCG/ACT inhaler Inhale 1-2 puffs into the lungs every 6 (six) hours as needed for wheezing or shortness of breath.   beclomethasone (QVAR REDIHALER) 80 MCG/ACT inhaler Inhale 2 puffs into the lungs 2 (two) times daily.   cetirizine (ZYRTEC) 10 MG tablet Take 30 mg by mouth daily.    FLUoxetine (PROZAC) 20 MG tablet TAKE 2 TABLETS BY MOUTH ONCE DAILY FOR ANXIETY.   meloxicam (MOBIC) 7.5 MG tablet Take 1 tablet (7.5 mg total) by mouth daily as needed for pain.  omeprazole (PRILOSEC) 10 MG capsule Take 10 mg by mouth daily.   Probiotic Product (PROBIOTIC DAILY PO) Take by mouth.    Allergies:   Patient has no known allergies.   Social History   Socioeconomic History   Marital status: Married    Spouse name: Not on file   Number of children: Not on file   Years of education: Not on file   Highest education level: Not on file  Occupational History   Not on file  Tobacco Use   Smoking status: Never   Smokeless tobacco: Never  Vaping Use   Vaping status: Never Used  Substance and Sexual Activity   Alcohol use: No    Alcohol/week: 0.0 standard drinks of alcohol   Drug use: No   Sexual activity: Yes  Other Topics Concern   Not  on file  Social History Narrative   Married.   4 children.   Works as a Manufacturing systems engineer.   Enjoys reading, spending time with her family.   Social Drivers of Corporate investment banker Strain: Not on file  Food Insecurity: Not on file  Transportation Needs: Not on file  Physical Activity: Not on file  Stress: Not on file  Social Connections: Not on file     Family History:  The patient's family history includes Breast cancer in her mother and paternal grandmother. She was adopted.   ROS:   Please see the history of present illness.    ROS All other systems reviewed and are negative.      No data to display             PHYSICAL EXAM:   VS:  BP 124/84 (BP Location: Left Arm, Patient Position: Sitting, Cuff Size: Large)   Pulse 94   Resp 16   Ht 5\' 3"  (1.6 m)   Wt 272 lb 6.4 oz (123.6 kg)   LMP 08/11/2018   SpO2 97%   BMI 48.25 kg/m    GEN: Well nourished, well developed, in no acute distress  HEENT: normal  Neck: no JVD, carotid bruits, or masses Cardiac: RRR; no murmurs, rubs, or gallops,no edema.  Intact distal pulses bilaterally.  Respiratory:  clear to auscultation bilaterally, normal work of breathing GI: soft, nontender, nondistended, + BS MS: no deformity or atrophy  Skin: warm and dry, no rash Neuro:  Alert and Oriented x 3, Strength and sensation are intact Psych: euthymic mood, full affect  Wt Readings from Last 3 Encounters:  09/11/23 272 lb 6.4 oz (123.6 kg)  06/23/23 268 lb (121.6 kg)  06/07/23 265 lb 6.4 oz (120.4 kg)      Studies/Labs Reviewed:   EKG Interpretation Date/Time:  Monday September 11 2023 08:47:02 EDT Ventricular Rate:  100 PR Interval:  148 QRS Duration:  88 QT Interval:  386 QTC Calculation: 497 R Axis:   -19  Text Interpretation: Sinus rhythm with occasional Premature ventricular complexes Minimal voltage criteria for LVH, may be normal variant ( R in aVL ) When compared with ECG of 23-Nov-2020 12:22, PREVIOUS ECG IS  PRESENT Confirmed by Armanda Magic ([id number]) on 09/11/2023 9:05:13 AM  EKG Interpretation Date/Time:  Monday September 11 2023 08:47:02 EDT Ventricular Rate:  100 PR Interval:  148 QRS Duration:  88 QT Interval:  386 QTC Calculation: 497 R Axis:   -19  Text Interpretation: Sinus rhythm with occasional Premature ventricular complexes Minimal voltage criteria for LVH, may be normal variant ( R in aVL ) When compared with ECG  of 23-Nov-2020 12:22, PREVIOUS ECG IS PRESENT Confirmed by Armanda Magic [phone]) on 09/11/2023 9:05:13 AM    Recent Labs: 01/09/2023: ALT 21 06/23/2023: BUN 11; Creatinine, Ser 0.82; Potassium 3.7; Sodium 137   Lipid Panel    Component Value Date/Time   CHOL 252 (H) 06/23/2023 1307   TRIG 164.0 (H) 06/23/2023 1307   HDL 61.10 06/23/2023 1307   CHOLHDL 4 06/23/2023 1307   VLDL 32.8 06/23/2023 1307   LDLCALC 158 (H) 06/23/2023 1307      ASSESSMENT:    1. Chest tightness   2. Agatston coronary artery calcium score less than 100   3. Palpitations      PLAN:  In order of problems listed above:  Chest Pain Coronary artery calcifications -she had a recent cor cal score of 10 only in the LCx -now having episodes of chest pain but they are very atypical and likely related to panic attacks -will get a coronary CTA to define coronary anatomy and rule out obstructive CAD -check a 2D echo  HLD -LDL goal < 70 -I have personally reviewed and interpreted outside labs performed by patient's PCP which showed LDL 158, HDL 61 on 06/23/2023 -start Atorvastatin 20mg  daily -repeat FLP and ALTin 6 weeks  Palpitations -she thinks these are related to panic attacks -she will have HRs as high as 120's -will get a 2 week zioptach to assess for arrhythmias  Time Spent: 20 minutes total time of encounter, including 15 minutes spent in face-to-face patient care on the date of this encounter. This time includes coordination of care and counseling regarding above mentioned problem  list. Remainder of non-face-to-face time involved reviewing chart documents/testing relevant to the patient encounter and documentation in the medical record. I have independently reviewed documentation from referring provider  Followup:  PRN pending results of coronary CTA  Medication Adjustments/Labs and Tests Ordered: Current medicines are reviewed at length with the patient today.  Concerns regarding medicines are outlined above.  Medication changes, Labs and Tests ordered today are listed in the Patient Instructions below.  There are no Patient Instructions on file for this visit.   Signed, Armanda Magic, MD  09/11/2023 9:09 AM    [facility] [address] [place], [place], [place]  [address] Phone: [phone]; Fax: [phone]

## 2023-09-11 NOTE — Addendum Note (Signed)
 Addended by: Frutoso Schatz on: 09/11/2023 09:15 AM   Modules accepted: Orders

## 2023-09-21 ENCOUNTER — Encounter (HOSPITAL_COMMUNITY): Payer: Self-pay

## 2023-09-25 ENCOUNTER — Ambulatory Visit (HOSPITAL_COMMUNITY)
Admission: RE | Admit: 2023-09-25 | Discharge: 2023-09-25 | Disposition: A | Discharge status: Home / Self Care | Source: Ambulatory Visit | Attending: Cardiology | Admitting: Cardiology

## 2023-09-25 DIAGNOSIS — R072 Precordial pain: Secondary | ICD-10-CM | POA: Diagnosis present

## 2023-09-25 MED ORDER — NITROGLYCERIN 0.4 MG SL SUBL
0.8000 mg | SUBLINGUAL_TABLET | Freq: Once | SUBLINGUAL | Status: AC
  Administered 2023-09-25: 0.8 mg via SUBLINGUAL

## 2023-09-25 MED ORDER — IOHEXOL 350 MG/ML SOLN
95.0000 mL | Freq: Once | INTRAVENOUS | Status: AC | PRN
  Administered 2023-09-25: 95 mL via INTRAVENOUS

## 2023-09-25 MED ORDER — NITROGLYCERIN 0.4 MG SL SUBL
SUBLINGUAL_TABLET | SUBLINGUAL | Status: AC
  Filled 2023-09-25: qty 2

## 2023-09-26 LAB — POCT I-STAT CREATININE: Creatinine, Ser: 0.9 mg/dL (ref 0.44–1.00)

## 2023-09-27 ENCOUNTER — Telehealth: Payer: Self-pay

## 2023-09-27 ENCOUNTER — Encounter: Payer: Self-pay | Admitting: Cardiology

## 2023-09-27 DIAGNOSIS — E785 Hyperlipidemia, unspecified: Secondary | ICD-10-CM

## 2023-09-27 DIAGNOSIS — I2510 Atherosclerotic heart disease of native coronary artery without angina pectoris: Secondary | ICD-10-CM | POA: Insufficient documentation

## 2023-09-27 DIAGNOSIS — Z79899 Other long term (current) drug therapy: Secondary | ICD-10-CM

## 2023-09-27 MED ORDER — ASPIRIN 81 MG PO TBEC: 81.0000 mg | DELAYED_RELEASE_TABLET | Freq: Every day | ORAL | Status: AC

## 2023-09-27 NOTE — Telephone Encounter (Signed)
 Spoke with pt regarding lab results. Pt was told to continue current medication and start ASA 81 mg once daily. Pt aware of labs to be drawn. FLP and ALT were ordered and released. Pt verbalized understanding. All questions, if any, were answered.

## 2023-09-27 NOTE — Telephone Encounter (Signed)
-----   Message from Armanda Magic sent at 09/27/2023 11:26 AM EDT ----- Coronary CTA showed a calcium score of 9.46 which for her age, sex and race is 87% tile.  There is mild plaque less than 25% and a nondominant left circumflex that anonymously arises from the right coronary cusp with a benign posterior course from the aorta to the lateral wall.  Continue statin and start aspirin 81 mg daily.  I would like her to come in for a fasting lipid panel and ALT

## 2023-10-02 ENCOUNTER — Ambulatory Visit (HOSPITAL_COMMUNITY): Attending: Cardiology

## 2023-10-02 DIAGNOSIS — R002 Palpitations: Secondary | ICD-10-CM | POA: Diagnosis present

## 2023-10-02 DIAGNOSIS — R072 Precordial pain: Secondary | ICD-10-CM | POA: Diagnosis present

## 2023-10-02 DIAGNOSIS — R931 Abnormal findings on diagnostic imaging of heart and coronary circulation: Secondary | ICD-10-CM

## 2023-10-02 DIAGNOSIS — R0789 Other chest pain: Secondary | ICD-10-CM

## 2023-10-02 LAB — ECHOCARDIOGRAM COMPLETE
AR max vel: 1.48 cm2
AV Area VTI: 1.43 cm2
AV Area mean vel: 1.41 cm2
AV Mean grad: 7 mmHg
AV Peak grad: 11.3 mmHg
Ao pk vel: 1.68 m/s
Area-P 1/2: 3.62 cm2
S' Lateral: 2.6 cm

## 2023-10-19 ENCOUNTER — Other Ambulatory Visit: Payer: Self-pay | Admitting: Primary Care

## 2023-10-19 DIAGNOSIS — F32A Depression, unspecified: Secondary | ICD-10-CM

## 2023-10-19 NOTE — Telephone Encounter (Signed)
Patient is due for CPE/follow up in late July, this will be required prior to any further refills.  Please schedule, thank you!   

## 2023-10-19 NOTE — Telephone Encounter (Signed)
 Called  pt and schedule a appt for cpe / labs

## 2023-10-26 DIAGNOSIS — R931 Abnormal findings on diagnostic imaging of heart and coronary circulation: Secondary | ICD-10-CM | POA: Diagnosis not present

## 2023-10-26 DIAGNOSIS — R002 Palpitations: Secondary | ICD-10-CM

## 2023-10-26 DIAGNOSIS — R0789 Other chest pain: Secondary | ICD-10-CM | POA: Diagnosis not present

## 2023-10-26 DIAGNOSIS — R072 Precordial pain: Secondary | ICD-10-CM | POA: Diagnosis not present

## 2023-12-25 ENCOUNTER — Other Ambulatory Visit: Payer: Self-pay | Admitting: Primary Care

## 2023-12-25 DIAGNOSIS — E785 Hyperlipidemia, unspecified: Secondary | ICD-10-CM

## 2023-12-25 DIAGNOSIS — E559 Vitamin D deficiency, unspecified: Secondary | ICD-10-CM

## 2023-12-25 DIAGNOSIS — E1165 Type 2 diabetes mellitus with hyperglycemia: Secondary | ICD-10-CM

## 2024-01-01 ENCOUNTER — Ambulatory Visit: Payer: Self-pay

## 2024-01-01 NOTE — Telephone Encounter (Signed)
 FYI Only or Action Required?: FYI only for provider.  Patient was last seen in primary care on 08/04/2023 by Gretta Comer POUR, NP. Called Nurse Triage reporting Hypertension. Symptoms began yesterday. Interventions attempted: Rest, hydration, or home remedies. Symptoms are: unchanged.  Triage Disposition: See PCP When Office is Open (Within 3 Days)  Patient/caregiver understands and will follow disposition?: Yes   Copied from CRM [id number]. Topic: Clinical - Red Word Triage >> Jan 01, 2024 12:00 PM Laquanda P wrote: [facility] that prompted transfer to Nurse Triage: blood pressure 178/98   ----------------------------------------------------------------------- From previous Reason for Contact - Scheduling: Patient/patient representative is calling to schedule an appointment. Refer to attachments for appointment information. Reason for Disposition  Systolic BP  >= 160 OR Diastolic >= 100  Answer Assessment - Initial Assessment Questions 1. BLOOD PRESSURE: What is the blood pressure? Did you take at least two measurements 5 minutes apart?     178/98 today  2. ONSET: When did you take your blood pressure?     Since yesterday  3. HOW: How did you take your blood pressure? (e.g., automatic home BP monitor, visiting nurse)     home 4. HISTORY: Do you have a history of high blood pressure?     Yes 5. MEDICINES: Are you taking any medicines for blood pressure? Have you missed any doses recently?     Does not take bp medication 6. OTHER SYMPTOMS: Do you have any symptoms? (e.g., blurred vision, chest pain, difficulty breathing, headache, weakness)     Joint pain, intermittent leg pain.   Additional info: Never needed blood pressure medication.  Protocols used: Blood Pressure - High-A-AH

## 2024-01-01 NOTE — Telephone Encounter (Signed)
 Noted, will evaluate.

## 2024-01-03 ENCOUNTER — Encounter: Payer: Self-pay | Admitting: Primary Care

## 2024-01-03 ENCOUNTER — Ambulatory Visit: Admitting: Primary Care

## 2024-01-03 ENCOUNTER — Ambulatory Visit: Payer: Self-pay | Admitting: Primary Care

## 2024-01-03 VITALS — BP 146/88 | HR 100 | Temp 97.0°F | Ht 63.0 in | Wt 277.0 lb

## 2024-01-03 DIAGNOSIS — I10 Essential (primary) hypertension: Secondary | ICD-10-CM

## 2024-01-03 DIAGNOSIS — R102 Pelvic and perineal pain: Secondary | ICD-10-CM

## 2024-01-03 LAB — POC URINALSYSI DIPSTICK (AUTOMATED)
Bilirubin, UA: NEGATIVE
Blood, UA: NEGATIVE
Glucose, UA: NEGATIVE
Ketones, UA: POSITIVE
Leukocytes, UA: NEGATIVE
Nitrite, UA: NEGATIVE
Protein, UA: POSITIVE — AB
Spec Grav, UA: 1.015 (ref 1.010–1.025)
Urobilinogen, UA: 0.2 U/dL
pH, UA: 6 (ref 5.0–8.0)

## 2024-01-03 MED ORDER — HYDROCHLOROTHIAZIDE 12.5 MG PO TABS: 12.5000 mg | ORAL_TABLET | Freq: Every day | ORAL | 0 refills | Status: DC

## 2024-01-03 NOTE — Patient Instructions (Signed)
 Start hydrochlorothiazide  12.5 mg once daily for high blood pressure.  Please schedule a follow up visit to meet back with me in 2-3 weeks for blood pressure check.   It was a pleasure to see you today!

## 2024-01-03 NOTE — Progress Notes (Signed)
 Subjective:    Patient ID: Susan Morales, female    DOB: 14-Aug-1970, 53 y.o.   MRN: [id number]  Hypertension Associated symptoms include headaches. Pertinent negatives include no chest pain or shortness of breath.    Susan Morales is a very pleasant 53 y.o. female with a history of CAD, sleep apnea, type 2 diabetes, fibromyalgia, elevated blood pressure reading without diagnosis of hypertension who presents today to discuss elevated blood pressure readings.  She contacted our office 2 days ago with reports of elevated blood pressure readings since last week.  Today she discusses blood pressure has been running 172/95, 178/104, 167/90. She is not managed on treatment.   She's been exercising daily with the infinity hoop, recently started bicycling. Over the last few weeks she's noticed increased joint pain to her knees, calves, and headaches. She is adopted, doesn't know much about family history. Evaluated at Urgent Care in December 2024 for elevated BP readings, was initiated on lisinopril 5 mg which caused her to feel shortness of breath.   She questions if she has a UTI. She denies dysuria, urinary frequency. She has noticed increased fatigue and intermittent pelvic pressure. She felt this way when evaluated at urgent care and was found to have UTI.   BP Readings from Last 3 Encounters:  01/03/24 (!) 146/88  09/25/23 (!) 147/74  09/11/23 124/84     Review of Systems  Respiratory:  Negative for shortness of breath.   Cardiovascular:  Negative for chest pain.  Genitourinary:  Negative for dysuria, frequency and hematuria.  Neurological:  Positive for headaches. Negative for dizziness.         Past Medical History:  Diagnosis Date   Adenomyosis 12/17/2018   Allergy    CAD (coronary artery disease), native coronary artery    Coronary CTA showed a calcium  score of 9.46 which for her age, sex and race is 87% tile.  There is mild plaque less than 25% and a nondominant left  circumflex that anonymously arises from the right coronary cusp with a benign posterior course from the aorta to the lateral wall.   Complication of anesthesia    Deviate septum, combative   COVID-19 virus infection 03/06/2020   Depression    GERD (gastroesophageal reflux disease)    Hemorrhoid    Pelvic pain 12/17/2018   UTI (urinary tract infection)     Social History   Socioeconomic History   Marital status: Married    Spouse name: Not on file   Number of children: Not on file   Years of education: Not on file   Highest education level: Not on file  Occupational History   Not on file  Tobacco Use   Smoking status: Never   Smokeless tobacco: Never  Vaping Use   Vaping status: Never Used  Substance and Sexual Activity   Alcohol use: No    Alcohol/week: 0.0 standard drinks of alcohol   Drug use: No   Sexual activity: Yes  Other Topics Concern   Not on file  Social History Narrative   Married.   4 children.   Works as a Manufacturing systems engineer.   Enjoys reading, spending time with her family.   Social Drivers of Corporate investment banker Strain: Not on file  Food Insecurity: Not on file  Transportation Needs: Not on file  Physical Activity: Not on file  Stress: Not on file  Social Connections: Not on file  Intimate Partner Violence: Not on file  Past Surgical History:  Procedure Laterality Date   ABLATION  2015   DIRECT LARYNGOSCOPY N/A 01/29/2013   Procedure: DIRECT LARYNGOSCOPY, esophagoscopy, bronchoscopy, ;  Surgeon: Merilee Kraft, MD;  Location: [facility];  Service: ENT;  Laterality: N/A;   LAPAROSCOPIC VAGINAL HYSTERECTOMY WITH SALPINGECTOMY Bilateral 12/17/2018   Procedure: LAPAROSCOPIC ASSISTED VAGINAL HYSTERECTOMY WITH SALPINGECTOMY;  Surgeon: Mat Browning, MD;  Location: [facility] [place];  Service: Gynecology;  Laterality: Bilateral;   UPPER GASTROINTESTINAL ENDOSCOPY      Family History  Adopted: Yes  Problem Relation Age of Onset    Breast cancer Mother        in 40's   Breast cancer Paternal Grandmother    Colon cancer Neg Hx     No Known Allergies  Current Outpatient Medications on File Prior to Visit  Medication Sig Dispense Refill   albuterol (VENTOLIN HFA) 108 (90 Base) MCG/ACT inhaler Inhale 1-2 puffs into the lungs every 6 (six) hours as needed for wheezing or shortness of breath.     aspirin  EC 81 MG tablet Take 1 tablet (81 mg total) by mouth daily. Swallow whole.     atorvastatin  (LIPITOR) 20 MG tablet Take 1 tablet (20 mg total) by mouth daily. 90 tablet 3   beclomethasone (QVAR  REDIHALER) 80 MCG/ACT inhaler Inhale 2 puffs into the lungs 2 (two) times daily. 1 each 0   cetirizine (ZYRTEC) 10 MG tablet Take 30 mg by mouth daily.      FLUoxetine  (PROZAC ) 20 MG tablet TAKE 2 TABLETS BY MOUTH ONCE DAILY FOR ANXIETY. 180 tablet 0   meloxicam  (MOBIC ) 7.5 MG tablet Take 1 tablet (7.5 mg total) by mouth daily as needed for pain. 30 tablet 0   omeprazole  (PRILOSEC) 10 MG capsule Take 10 mg by mouth daily.     Probiotic Product (PROBIOTIC DAILY PO) Take by mouth.     No current facility-administered medications on file prior to visit.    BP (!) 146/88   Pulse 100   Temp (!) 97 F (36.1 C) (Temporal)   Ht 5' 3 (1.6 m)   Wt 277 lb (125.6 kg)   LMP 08/11/2018   SpO2 99%   BMI 49.07 kg/m  Objective:   Physical Exam Cardiovascular:     Rate and Rhythm: Normal rate and regular rhythm.  Pulmonary:     Effort: Pulmonary effort is normal.     Breath sounds: Normal breath sounds.  Musculoskeletal:     Cervical back: Neck supple.  Skin:    General: Skin is warm and dry.  Neurological:     Mental Status: She is alert and oriented to person, place, and time.  Psychiatric:        Mood and Affect: Mood normal.           Assessment & Plan:  Essential hypertension Assessment & Plan: Above goal today, also with prior office visits and home readings.   Urinalysis negative today. Start  hydrochlorothiazide  12.5 mg once daily.  We will plan to see her back in 2 to 3 weeks for blood pressure check and BMP.  Orders: -     hydroCHLOROthiazide ; Take 1 tablet (12.5 mg total) by mouth daily. for blood pressure.  Dispense: 30 tablet; Refill: 0  Pelvic pain -     POCT Urinalysis Dipstick (Automated)        Comer MARLA Gaskins, NP

## 2024-01-03 NOTE — Assessment & Plan Note (Signed)
 Above goal today, also with prior office visits and home readings.   Urinalysis negative today. Start hydrochlorothiazide  12.5 mg once daily.  We will plan to see her back in 2 to 3 weeks for blood pressure check and BMP.

## 2024-01-05 ENCOUNTER — Other Ambulatory Visit: Payer: Self-pay | Admitting: Primary Care

## 2024-01-05 DIAGNOSIS — Z1231 Encounter for screening mammogram for malignant neoplasm of breast: Secondary | ICD-10-CM

## 2024-01-11 ENCOUNTER — Ambulatory Visit: Payer: Self-pay | Admitting: Primary Care

## 2024-01-11 ENCOUNTER — Other Ambulatory Visit (INDEPENDENT_AMBULATORY_CARE_PROVIDER_SITE_OTHER)

## 2024-01-11 DIAGNOSIS — E559 Vitamin D deficiency, unspecified: Secondary | ICD-10-CM | POA: Diagnosis not present

## 2024-01-11 DIAGNOSIS — E1165 Type 2 diabetes mellitus with hyperglycemia: Secondary | ICD-10-CM

## 2024-01-11 DIAGNOSIS — E785 Hyperlipidemia, unspecified: Secondary | ICD-10-CM

## 2024-01-11 LAB — COMPREHENSIVE METABOLIC PANEL WITH GFR
ALT: 28 U/L (ref 0–35)
AST: 20 U/L (ref 0–37)
Albumin: 4.2 g/dL (ref 3.5–5.2)
Alkaline Phosphatase: 79 U/L (ref 39–117)
BUN: 14 mg/dL (ref 6–23)
CO2: 32 meq/L (ref 19–32)
Calcium: 9.4 mg/dL (ref 8.4–10.5)
Chloride: 99 meq/L (ref 96–112)
Creatinine, Ser: 0.72 mg/dL (ref 0.40–1.20)
GFR: 94.89 mL/min (ref >60.00)
Glucose, Bld: 111 mg/dL — ABNORMAL HIGH (ref 70–99)
Potassium: 4.5 meq/L (ref 3.5–5.1)
Sodium: 139 meq/L (ref 135–145)
Total Bilirubin: 0.7 mg/dL (ref 0.2–1.2)
Total Protein: 7.1 g/dL (ref 6.0–8.3)

## 2024-01-11 LAB — HEMOGLOBIN A1C: Hgb A1c MFr Bld: 6.2 % (ref 4.6–6.5)

## 2024-01-11 LAB — LIPID PANEL
Cholesterol: 204 mg/dL — ABNORMAL HIGH (ref 0–200)
HDL: 69.2 mg/dL (ref >39.00)
LDL Cholesterol: 113 mg/dL — ABNORMAL HIGH (ref 0–99)
NonHDL: 134.56
Total CHOL/HDL Ratio: 3
Triglycerides: 107 mg/dL (ref 0.0–149.0)
VLDL: 21.4 mg/dL (ref 0.0–40.0)

## 2024-01-11 LAB — VITAMIN D 25 HYDROXY (VIT D DEFICIENCY, FRACTURES): VITD: 22.74 ng/mL — ABNORMAL LOW (ref 30.00–100.00)

## 2024-01-17 ENCOUNTER — Ambulatory Visit
Admission: RE | Admit: 2024-01-17 | Discharge: 2024-01-17 | Disposition: A | Discharge status: Home / Self Care | Source: Ambulatory Visit | Attending: Primary Care | Admitting: Primary Care

## 2024-01-17 DIAGNOSIS — Z1231 Encounter for screening mammogram for malignant neoplasm of breast: Secondary | ICD-10-CM

## 2024-01-18 ENCOUNTER — Telehealth: Payer: Self-pay

## 2024-01-18 ENCOUNTER — Encounter: Payer: Self-pay | Admitting: Primary Care

## 2024-01-18 ENCOUNTER — Other Ambulatory Visit (HOSPITAL_COMMUNITY): Payer: Self-pay

## 2024-01-18 ENCOUNTER — Ambulatory Visit: Admitting: Primary Care

## 2024-01-18 ENCOUNTER — Ambulatory Visit: Admitting: Podiatry

## 2024-01-18 VITALS — BP 122/70 | HR 81 | Temp 97.1°F | Ht 63.0 in | Wt 280.0 lb

## 2024-01-18 DIAGNOSIS — E559 Vitamin D deficiency, unspecified: Secondary | ICD-10-CM | POA: Diagnosis not present

## 2024-01-18 DIAGNOSIS — I10 Essential (primary) hypertension: Secondary | ICD-10-CM | POA: Diagnosis not present

## 2024-01-18 DIAGNOSIS — G4733 Obstructive sleep apnea (adult) (pediatric): Secondary | ICD-10-CM

## 2024-01-18 DIAGNOSIS — E1165 Type 2 diabetes mellitus with hyperglycemia: Secondary | ICD-10-CM

## 2024-01-18 DIAGNOSIS — K219 Gastro-esophageal reflux disease without esophagitis: Secondary | ICD-10-CM | POA: Diagnosis not present

## 2024-01-18 DIAGNOSIS — Z0001 Encounter for general adult medical examination with abnormal findings: Secondary | ICD-10-CM | POA: Diagnosis not present

## 2024-01-18 DIAGNOSIS — F32A Depression, unspecified: Secondary | ICD-10-CM | POA: Diagnosis not present

## 2024-01-18 DIAGNOSIS — E785 Hyperlipidemia, unspecified: Secondary | ICD-10-CM | POA: Diagnosis not present

## 2024-01-18 DIAGNOSIS — F419 Anxiety disorder, unspecified: Secondary | ICD-10-CM | POA: Diagnosis not present

## 2024-01-18 DIAGNOSIS — Z7985 Long-term (current) use of injectable non-insulin antidiabetic drugs: Secondary | ICD-10-CM

## 2024-01-18 DIAGNOSIS — M797 Fibromyalgia: Secondary | ICD-10-CM | POA: Diagnosis not present

## 2024-01-18 DIAGNOSIS — R519 Headache, unspecified: Secondary | ICD-10-CM

## 2024-01-18 MED ORDER — FLUOXETINE HCL 20 MG PO TABS: 60.0000 mg | ORAL_TABLET | Freq: Every day | ORAL | 3 refills | Status: AC

## 2024-01-18 MED ORDER — TIRZEPATIDE 2.5 MG/0.5ML ~~LOC~~ SOAJ: 2.5000 mg | SUBCUTANEOUS | 0 refills | Status: DC

## 2024-01-18 NOTE — Assessment & Plan Note (Signed)
 Using dental appliance, continue same.

## 2024-01-18 NOTE — Assessment & Plan Note (Signed)
 Stable.  No concerns today. Continue to monitor.

## 2024-01-18 NOTE — Assessment & Plan Note (Signed)
 Declines second Shingrix  vaccine. Mammogram UTD Colonoscopy UTD, due 2027  Discussed the importance of a healthy diet and regular exercise in order for weight loss, and to reduce the risk of further co-morbidity.  Exam stable. Labs pending.  Follow up in 1 year for repeat physical.

## 2024-01-18 NOTE — Assessment & Plan Note (Addendum)
 Deteriorated.  Discussed options for treatment including increasing dose of fluoxetine  versus changing therapy. We opted to increase fluoxetine  to 60 mg daily.  She will update.

## 2024-01-18 NOTE — Assessment & Plan Note (Addendum)
 Improved.  Reviewed lipid panel from July 2025. Continue atorvastatin  20 mg daily.

## 2024-01-18 NOTE — Assessment & Plan Note (Signed)
 Uncontrolled.  She is taking an unknown amount of vitamin D  OTC. She will contact us  to notify us . Will adjust accordingly.

## 2024-01-18 NOTE — Assessment & Plan Note (Signed)
 Stable.       - Continue to monitor

## 2024-01-18 NOTE — Telephone Encounter (Signed)
 Pharmacy Patient Advocate Encounter   Received notification from CoverMyMeds that prior authorization for Mounjaro  2.5 is required/requested.   Insurance verification completed.   The patient is insured through [employer] .   Per test claim: PA required; PA submitted to above mentioned insurance via CoverMyMeds Key/confirmation #/EOC [id number] Status is pending

## 2024-01-18 NOTE — Assessment & Plan Note (Signed)
 Deteriorated slightly with A1c of 6.2.  We discussed options, she would like to try Mounjaro .  Start tirzepitide (Mounjaro ) for diabetes/weight loss. Start by injecting 2.5 mg into the skin once weekly for 4 weeks, then increase to 5 mg once weekly thereafter.   Follow-up in 3 to 6 months.

## 2024-01-18 NOTE — Assessment & Plan Note (Signed)
 Controlled.  Continue omeprazole  20 mg daily and famotidine 10 mg daily.

## 2024-01-18 NOTE — Telephone Encounter (Signed)
 Pharmacy Patient Advocate Encounter  Received notification from [facility] that Prior Authorization for Mounjaro  2.5 has been APPROVED from 01/18/24 to 01/17/25. Ran test claim, Copay is $35.00. This test claim was processed through [facility]- copay amounts may vary at other pharmacies due to pharmacy/plan contracts, or as the patient moves through the different stages of their insurance plan.   PA #/Case ID/Reference #: [id number]

## 2024-01-18 NOTE — Assessment & Plan Note (Signed)
 Controlled!  Continue hydrochlorothiazide  12.5 mg daily. Reviewed CMP from July 2025

## 2024-01-18 NOTE — Progress Notes (Signed)
 Subjective:    Patient ID: Susan Morales, female    DOB: 1971-01-31, 53 y.o.   MRN: [id number]  HPI  Susan Morales is a very pleasant 53 y.o. female who presents today for complete physical and follow up of chronic conditions.  She would like to discuss obesity. Previously managed on Ozempic  up to 2 mg weekly, but she could not tolerate due to side effects of esophageal reflux. She's frustrated by her lack of weight loss despite efforts. She cannot exercise much due to her chronic knee pain.   She would also like to discuss anxiety/depression. She's under increased stress with her health and her children. Symptoms include feeling more down/sad, feeling overwhelmed. Historically, fluoxetine  has helped but doesn't feel as effective now.   Wt Readings from Last 3 Encounters:  01/18/24 280 lb (127 kg)  01/03/24 277 lb (125.6 kg)  09/11/23 272 lb 6.4 oz (123.6 kg)     Immunizations: -Tetanus: Completed in 2022 -Shingles: Completed Shingrix  vaccine x 1 dose, declines second dose due to side effects.   Diet: Fair diet.  Exercise: No regular exercise.  Eye exam: Completes annually  Dental exam: Completes semi-annually    Pap Smear: Hysterectomy  Mammogram: Completed yesterday   Colonoscopy: Completed in 2022, due 2027   BP Readings from Last 3 Encounters:  01/18/24 122/70  01/03/24 (!) 146/88  09/25/23 (!) 147/74      Review of Systems  Constitutional:  Negative for unexpected weight change.  HENT:  Negative for rhinorrhea.   Respiratory:  Negative for cough and shortness of breath.   Cardiovascular:  Negative for chest pain.  Gastrointestinal:  Negative for constipation and diarrhea.  Genitourinary:  Negative for difficulty urinating.  Musculoskeletal:  Positive for arthralgias.  Skin:  Negative for rash.  Allergic/Immunologic: Negative for environmental allergies.  Neurological:  Positive for headaches. Negative for dizziness.  Psychiatric/Behavioral:  The  patient is nervous/anxious.          Past Medical History:  Diagnosis Date   Adenomyosis 12/17/2018   Allergy    CAD (coronary artery disease), native coronary artery    Coronary CTA showed a calcium  score of 9.46 which for her age, sex and race is 87% tile.  There is mild plaque less than 25% and a nondominant left circumflex that anonymously arises from the right coronary cusp with a benign posterior course from the aorta to the lateral wall.   Complication of anesthesia    Deviate septum, combative   COVID-19 virus infection 03/06/2020   Depression    GERD (gastroesophageal reflux disease)    Hemorrhoid    Pelvic pain 12/17/2018   UTI (urinary tract infection)     Social History   Socioeconomic History   Marital status: Married    Spouse name: Not on file   Number of children: Not on file   Years of education: Not on file   Highest education level: Not on file  Occupational History   Not on file  Tobacco Use   Smoking status: Never   Smokeless tobacco: Never  Vaping Use   Vaping status: Never Used  Substance and Sexual Activity   Alcohol use: No    Alcohol/week: 0.0 standard drinks of alcohol   Drug use: No   Sexual activity: Yes  Other Topics Concern   Not on file  Social History Narrative   Married.   4 children.   Works as a Manufacturing systems engineer.   Enjoys reading, spending time with her  family.   Social Drivers of Corporate investment banker Strain: Not on file  Food Insecurity: Not on file  Transportation Needs: Not on file  Physical Activity: Not on file  Stress: Not on file  Social Connections: Not on file  Intimate Partner Violence: Not on file    Past Surgical History:  Procedure Laterality Date   ABLATION  2015   DIRECT LARYNGOSCOPY N/A 01/29/2013   Procedure: DIRECT LARYNGOSCOPY, esophagoscopy, bronchoscopy, ;  Surgeon: Merilee Kraft, MD;  Location: [facility];  Service: ENT;  Laterality: N/A;   LAPAROSCOPIC VAGINAL HYSTERECTOMY WITH  SALPINGECTOMY Bilateral 12/17/2018   Procedure: LAPAROSCOPIC ASSISTED VAGINAL HYSTERECTOMY WITH SALPINGECTOMY;  Surgeon: Mat Browning, MD;  Location: [facility];  Service: Gynecology;  Laterality: Bilateral;   UPPER GASTROINTESTINAL ENDOSCOPY      Family History  Adopted: Yes  Problem Relation Age of Onset   Breast cancer Mother        in 44's   Breast cancer Paternal Grandmother    Colon cancer Neg Hx     No Known Allergies  Current Outpatient Medications on File Prior to Visit  Medication Sig Dispense Refill   albuterol (VENTOLIN HFA) 108 (90 Base) MCG/ACT inhaler Inhale 1-2 puffs into the lungs every 6 (six) hours as needed for wheezing or shortness of breath.     aspirin  EC 81 MG tablet Take 1 tablet (81 mg total) by mouth daily. Swallow whole.     atorvastatin  (LIPITOR) 20 MG tablet Take 1 tablet (20 mg total) by mouth daily. 90 tablet 3   beclomethasone (QVAR  REDIHALER) 80 MCG/ACT inhaler Inhale 2 puffs into the lungs 2 (two) times daily. 1 each 0   cetirizine (ZYRTEC) 10 MG tablet Take 30 mg by mouth daily.      hydrochlorothiazide  (HYDRODIURIL ) 12.5 MG tablet Take 1 tablet (12.5 mg total) by mouth daily. for blood pressure. 30 tablet 0   meloxicam  (MOBIC ) 7.5 MG tablet Take 1 tablet (7.5 mg total) by mouth daily as needed for pain. 30 tablet 0   omeprazole  (PRILOSEC) 10 MG capsule Take 10 mg by mouth daily.     Probiotic Product (PROBIOTIC DAILY PO) Take by mouth.     No current facility-administered medications on file prior to visit.    BP 122/70   Pulse 81   Temp (!) 97.1 F (36.2 C) (Temporal)   Ht 5' 3 (1.6 m)   Wt 280 lb (127 kg)   LMP 08/11/2018   PF 98 L/min   BMI 49.60 kg/m  Objective:   Physical Exam HENT:     Right Ear: Tympanic membrane and ear canal normal.     Left Ear: Tympanic membrane and ear canal normal.  Eyes:     Pupils: Pupils are equal, round, and reactive to light.  Cardiovascular:     Rate and Rhythm: Normal rate  and regular rhythm.  Pulmonary:     Effort: Pulmonary effort is normal.     Breath sounds: Normal breath sounds.  Abdominal:     General: Bowel sounds are normal.     Palpations: Abdomen is soft.     Tenderness: There is no abdominal tenderness.  Musculoskeletal:        General: Normal range of motion.     Cervical back: Neck supple.  Skin:    General: Skin is warm and dry.  Neurological:     Mental Status: She is alert and oriented to person, place, and time.  Cranial Nerves: No cranial nerve deficit.     Deep Tendon Reflexes:     Reflex Scores:      Patellar reflexes are 2+ on the right side and 2+ on the left side. Psychiatric:        Mood and Affect: Mood normal.           Assessment & Plan:  Moderate obstructive sleep apnea Assessment & Plan: Using dental appliance, continue same.    Gastroesophageal reflux disease, unspecified whether esophagitis present Assessment & Plan: Controlled.  Continue omeprazole  20 mg daily and famotidine 10 mg daily.    Anxiety and depression Assessment & Plan: Deteriorated.  Discussed options for treatment including increasing dose of fluoxetine  versus changing therapy. We opted to increase fluoxetine  to 60 mg daily.  She will update.  Orders: -     FLUoxetine  HCl; Take 3 tablets (60 mg total) by mouth daily. for anxiety and depression.  Dispense: 270 tablet; Refill: 3  Hyperlipidemia, unspecified hyperlipidemia type Assessment & Plan: Improved.  Reviewed lipid panel from July 2025. Continue atorvastatin  20 mg daily.    Controlled type 2 diabetes mellitus with hyperglycemia, without long-term current use of insulin (HCC) Assessment & Plan: Deteriorated slightly with A1c of 6.2.  We discussed options, she would like to try Mounjaro .  Start tirzepitide (Mounjaro ) for diabetes/weight loss. Start by injecting 2.5 mg into the skin once weekly for 4 weeks, then increase to 5 mg once weekly thereafter.   Follow-up in 3 to  6 months.  Orders: -     Tirzepatide ; Inject 2.5 mg into the skin once a week. for diabetes.  Dispense: 2 mL; Refill: 0  Frequent headaches Assessment & Plan: Stable.  No concerns today. Continue to monitor.    Fibromyalgia Assessment & Plan: Stable.  Continue to monitor.    Vitamin D  deficiency Assessment & Plan: Uncontrolled.  She is taking an unknown amount of vitamin D  OTC. She will contact us  to notify us . Will adjust accordingly.   Encounter for annual general medical examination with abnormal findings in adult Assessment & Plan: Declines second Shingrix  vaccine. Mammogram UTD Colonoscopy UTD, due 2027  Discussed the importance of a healthy diet and regular exercise in order for weight loss, and to reduce the risk of further co-morbidity.  Exam stable. Labs pending.  Follow up in 1 year for repeat physical.    Essential hypertension Assessment & Plan: Controlled!  Continue hydrochlorothiazide  12.5 mg daily. Reviewed CMP from July 2025         Comer MARLA Gaskins, NP

## 2024-01-22 ENCOUNTER — Ambulatory Visit: Payer: Self-pay | Admitting: Primary Care

## 2024-02-04 ENCOUNTER — Other Ambulatory Visit: Payer: Self-pay | Admitting: Primary Care

## 2024-02-04 DIAGNOSIS — I10 Essential (primary) hypertension: Secondary | ICD-10-CM

## 2024-03-02 ENCOUNTER — Other Ambulatory Visit: Payer: Self-pay | Admitting: Primary Care

## 2024-03-02 DIAGNOSIS — J4530 Mild persistent asthma, uncomplicated: Secondary | ICD-10-CM

## 2024-03-15 ENCOUNTER — Encounter: Payer: Self-pay | Admitting: Primary Care

## 2024-03-15 ENCOUNTER — Ambulatory Visit: Admitting: Primary Care

## 2024-03-15 VITALS — BP 132/84 | HR 95 | Temp 98.0°F | Ht 63.0 in | Wt 284.0 lb

## 2024-03-15 DIAGNOSIS — M545 Low back pain, unspecified: Secondary | ICD-10-CM | POA: Diagnosis not present

## 2024-03-15 DIAGNOSIS — F419 Anxiety disorder, unspecified: Secondary | ICD-10-CM | POA: Diagnosis not present

## 2024-03-15 DIAGNOSIS — E1165 Type 2 diabetes mellitus with hyperglycemia: Secondary | ICD-10-CM

## 2024-03-15 DIAGNOSIS — F32A Depression, unspecified: Secondary | ICD-10-CM | POA: Diagnosis not present

## 2024-03-15 DIAGNOSIS — Z7985 Long-term (current) use of injectable non-insulin antidiabetic drugs: Secondary | ICD-10-CM

## 2024-03-15 LAB — POC URINALSYSI DIPSTICK (AUTOMATED)
Bilirubin, UA: NEGATIVE
Blood, UA: NEGATIVE
Glucose, UA: NEGATIVE
Ketones, UA: NEGATIVE
Leukocytes, UA: NEGATIVE
Nitrite, UA: NEGATIVE
Protein, UA: NEGATIVE
Spec Grav, UA: <=1.005 — AB (ref 1.010–1.025)
Urobilinogen, UA: 2 U/dL — AB
pH, UA: 6 (ref 5.0–8.0)

## 2024-03-15 MED ORDER — MELOXICAM 7.5 MG PO TABS: 7.5000 mg | ORAL_TABLET | Freq: Every day | ORAL | 0 refills | Status: AC | PRN

## 2024-03-15 MED ORDER — TIRZEPATIDE 5 MG/0.5ML ~~LOC~~ SOAJ
5.0000 mg | SUBCUTANEOUS | 0 refills | Status: AC
Start: 2024-03-15 — End: ?

## 2024-03-15 NOTE — Assessment & Plan Note (Signed)
 Suspect muscular etiology given ability to provoke pain and based on exam.  Since meloxicam  resolved her pain temporarily we will continue meloxicam  at 7.5 mg once to twice daily. New prescription provided.  She will update if no improvement.

## 2024-03-15 NOTE — Progress Notes (Signed)
 Subjective:    Patient ID: Susan Morales, female    DOB: Dec 16, 1970, 53 y.o.   MRN: [id number]  Susan Morales is a very pleasant 53 y.o. female with a history of type 2 diabetes, fibromyalgia, obesity who presents today to discuss back pain and anxiety.  She is also needing her next dose of Mounjaro .  Her pain is located to the right lower thoracic back which began about 10 days ago. She describes her pain as pulling and sharp. Her pain is reproducible with movements, improved with rest. She's tried naproxen without much improvement. She took Meloxicam  with complete resolve.   She denies dysuria, increased frequency, hematuria.   Chronic history of anxiety depression and is managed on fluoxetine  60 mg daily.  She has been taking fluoxetine  40 mg daily but is considering increasing her dose.  Over the last several weeks she has undergone increased personal stress at home.  She has never taken anything else for anxiety except for fluoxetine .  Historically, the fluoxetine  has helped with symptoms.   Review of Systems  Genitourinary:  Negative for dysuria, frequency and hematuria.  Musculoskeletal:  Positive for arthralgias and back pain.  Psychiatric/Behavioral:  The patient is nervous/anxious.          Past Medical History:  Diagnosis Date   Adenomyosis 12/17/2018   Allergy    CAD (coronary artery disease), native coronary artery    Coronary CTA showed a calcium  score of 9.46 which for her age, sex and race is 87% tile.  There is mild plaque less than 25% and a nondominant left circumflex that anonymously arises from the right coronary cusp with a benign posterior course from the aorta to the lateral wall.   Complication of anesthesia    Deviate septum, combative   COVID-19 virus infection 03/06/2020   Depression    GERD (gastroesophageal reflux disease)    Hemorrhoid    Pelvic pain 12/17/2018   UTI (urinary tract infection)     Social History   Socioeconomic History    Marital status: Married    Spouse name: Not on file   Number of children: Not on file   Years of education: Not on file   Highest education level: Not on file  Occupational History   Not on file  Tobacco Use   Smoking status: Never   Smokeless tobacco: Never  Vaping Use   Vaping status: Never Used  Substance and Sexual Activity   Alcohol use: No    Alcohol/week: 0.0 standard drinks of alcohol   Drug use: No   Sexual activity: Yes  Other Topics Concern   Not on file  Social History Narrative   Married.   4 children.   Works as a Manufacturing systems engineer.   Enjoys reading, spending time with her family.   Social Drivers of Corporate investment banker Strain: Not on file  Food Insecurity: Not on file  Transportation Needs: Not on file  Physical Activity: Not on file  Stress: Not on file  Social Connections: Not on file  Intimate Partner Violence: Not on file    Past Surgical History:  Procedure Laterality Date   ABLATION  2015   DIRECT LARYNGOSCOPY N/A 01/29/2013   Procedure: DIRECT LARYNGOSCOPY, esophagoscopy, bronchoscopy, ;  Surgeon: Merilee Kraft, MD;  Location: [facility];  Service: ENT;  Laterality: N/A;   LAPAROSCOPIC VAGINAL HYSTERECTOMY WITH SALPINGECTOMY Bilateral 12/17/2018   Procedure: LAPAROSCOPIC ASSISTED VAGINAL HYSTERECTOMY WITH SALPINGECTOMY;  Surgeon: Mat Browning, MD;  Location:  [facility];  Service: Gynecology;  Laterality: Bilateral;   UPPER GASTROINTESTINAL ENDOSCOPY      Family History  Adopted: Yes  Problem Relation Age of Onset   Breast cancer Mother        in 15's   Breast cancer Paternal Grandmother    Colon cancer Neg Hx     No Known Allergies  Current Outpatient Medications on File Prior to Visit  Medication Sig Dispense Refill   albuterol (VENTOLIN HFA) 108 (90 Base) MCG/ACT inhaler Inhale 1-2 puffs into the lungs every 6 (six) hours as needed for wheezing or shortness of breath.     aspirin  EC 81 MG tablet Take 1  tablet (81 mg total) by mouth daily. Swallow whole.     atorvastatin  (LIPITOR) 20 MG tablet Take 1 tablet (20 mg total) by mouth daily. 90 tablet 3   beclomethasone (QVAR  REDIHALER) 80 MCG/ACT inhaler INHALE 2 PUFFS INTO THE LUNGS TWICE A DAY 10.6 g 5   cetirizine (ZYRTEC) 10 MG tablet Take 30 mg by mouth daily.      FLUoxetine  (PROZAC ) 20 MG tablet Take 3 tablets (60 mg total) by mouth daily. for anxiety and depression. 270 tablet 3   hydrochlorothiazide  (HYDRODIURIL ) 12.5 MG tablet TAKE 1 TABLET (12.5 MG TOTAL) BY MOUTH DAILY FOR BLOOD PRESSURE 90 tablet 2   omeprazole  (PRILOSEC) 10 MG capsule Take 10 mg by mouth daily.     Probiotic Product (PROBIOTIC DAILY PO) Take by mouth.     No current facility-administered medications on file prior to visit.    BP 132/84   Pulse 95   Temp 98 F (36.7 C) (Oral)   Ht 5' 3 (1.6 m)   Wt 284 lb (128.8 kg)   LMP 08/11/2018   SpO2 95%   BMI 50.31 kg/m  Objective:   Physical Exam Cardiovascular:     Rate and Rhythm: Normal rate.  Pulmonary:     Effort: Pulmonary effort is normal.  Musculoskeletal:     Thoracic back: No tenderness. Normal range of motion.       Back:     Comments: Movement pain with bilateral twisting/rotation and flexion of thoracic spine.  Skin:    General: Skin is warm and dry.  Neurological:     Mental Status: She is alert.     Physical Exam        Assessment & Plan:  Acute right-sided low back pain, unspecified whether sciatica present Assessment & Plan: Suspect muscular etiology given ability to provoke pain and based on exam.  Since meloxicam  resolved her pain temporarily we will continue meloxicam  at 7.5 mg once to twice daily. New prescription provided.  She will update if no improvement.  Orders: -     POCT Urinalysis Dipstick (Automated) -     Meloxicam ; Take 1 tablet (7.5 mg total) by mouth daily as needed for pain.  Dispense: 30 tablet; Refill: 0  Controlled type 2 diabetes mellitus with  hyperglycemia, without long-term current use of insulin (HCC) -     Tirzepatide ; Inject 5 mg into the skin once a week. for diabetes.  Dispense: 2 mL; Refill: 0  Anxiety and depression Assessment & Plan: Deteriorated.  Increase fluoxetine  to 60 mg daily for now. Consider changing to a different agent if warranted.  She will update.     Assessment and Plan Assessment & Plan         Comer MARLA Gaskins, NP      History of Present  Illness

## 2024-03-15 NOTE — Assessment & Plan Note (Signed)
 Deteriorated.  Increase fluoxetine  to 60 mg daily for now. Consider changing to a different agent if warranted.  She will update.

## 2024-04-26 NOTE — Telephone Encounter (Signed)
 I have sent patient message that pcp is out of office.

## 2024-07-17 ENCOUNTER — Ambulatory Visit: Admitting: Podiatry
